# Patient Record
Sex: Female | Born: 1975 | Race: Black or African American | Hispanic: No | Marital: Married | Smoking: Never smoker
Health system: Southern US, Community
[De-identification: ages and names within clinical notes are randomized; demographics above are authoritative.]

## PROBLEM LIST (undated history)

## (undated) ENCOUNTER — Emergency Department (HOSPITAL_COMMUNITY): Payer: BC Managed Care – PPO

## (undated) DIAGNOSIS — O24419 Gestational diabetes mellitus in pregnancy, unspecified control: Secondary | ICD-10-CM

## (undated) DIAGNOSIS — E119 Type 2 diabetes mellitus without complications: Secondary | ICD-10-CM

## (undated) DIAGNOSIS — O139 Gestational [pregnancy-induced] hypertension without significant proteinuria, unspecified trimester: Secondary | ICD-10-CM

## (undated) HISTORY — PX: BREAST SURGERY: SHX581

## (undated) HISTORY — PX: BREAST LUMPECTOMY: SHX2

---

## 2000-07-07 ENCOUNTER — Inpatient Hospital Stay (HOSPITAL_COMMUNITY): Admission: AD | Admit: 2000-07-07 | Discharge: 2000-07-07 | Payer: Self-pay | Admitting: Obstetrics

## 2000-07-08 ENCOUNTER — Ambulatory Visit (HOSPITAL_COMMUNITY): Admission: RE | Admit: 2000-07-08 | Discharge: 2000-07-08 | Payer: Self-pay | Admitting: Obstetrics & Gynecology

## 2000-07-08 ENCOUNTER — Encounter: Payer: Self-pay | Admitting: Obstetrics & Gynecology

## 2000-07-13 ENCOUNTER — Ambulatory Visit (HOSPITAL_COMMUNITY): Admission: RE | Admit: 2000-07-13 | Discharge: 2000-07-13 | Payer: Self-pay | Admitting: Obstetrics & Gynecology

## 2000-07-13 ENCOUNTER — Encounter (INDEPENDENT_AMBULATORY_CARE_PROVIDER_SITE_OTHER): Payer: Self-pay

## 2003-08-05 ENCOUNTER — Other Ambulatory Visit: Admission: RE | Admit: 2003-08-05 | Discharge: 2003-08-05 | Payer: Self-pay | Admitting: Obstetrics & Gynecology

## 2007-02-04 ENCOUNTER — Ambulatory Visit (HOSPITAL_COMMUNITY): Admission: RE | Admit: 2007-02-04 | Discharge: 2007-02-04 | Payer: Self-pay | Admitting: Obstetrics & Gynecology

## 2007-04-25 ENCOUNTER — Ambulatory Visit (HOSPITAL_COMMUNITY): Admission: RE | Admit: 2007-04-25 | Discharge: 2007-04-25 | Payer: Self-pay | Admitting: Obstetrics & Gynecology

## 2007-05-06 ENCOUNTER — Encounter: Admission: RE | Admit: 2007-05-06 | Discharge: 2007-05-06 | Payer: Self-pay | Admitting: Obstetrics & Gynecology

## 2007-05-06 ENCOUNTER — Ambulatory Visit (HOSPITAL_COMMUNITY): Admission: RE | Admit: 2007-05-06 | Discharge: 2007-05-06 | Payer: Self-pay | Admitting: Obstetrics & Gynecology

## 2007-05-20 ENCOUNTER — Inpatient Hospital Stay (HOSPITAL_COMMUNITY): Admission: AD | Admit: 2007-05-20 | Discharge: 2007-05-20 | Payer: Self-pay | Admitting: Obstetrics & Gynecology

## 2007-05-24 ENCOUNTER — Ambulatory Visit (HOSPITAL_COMMUNITY): Admission: RE | Admit: 2007-05-24 | Discharge: 2007-05-24 | Payer: Self-pay | Admitting: Obstetrics & Gynecology

## 2007-06-13 ENCOUNTER — Inpatient Hospital Stay (HOSPITAL_COMMUNITY): Admission: AD | Admit: 2007-06-13 | Discharge: 2007-06-18 | Payer: Self-pay | Admitting: Obstetrics

## 2007-06-14 ENCOUNTER — Encounter: Payer: Self-pay | Admitting: Obstetrics

## 2009-05-25 IMAGING — US US OB FOLLOW-UP
1 series · 14 of 28 positions shown · non-contrast
Comparison: none

OBSTETRICAL ULTRASOUND:
 This ultrasound was performed in The [HOSPITAL], and the [facility] report will be stored to [REDACTED] PACS.

[Series 1: us ob follow-up · 14 of 69 slices shown]
[im 3/69]
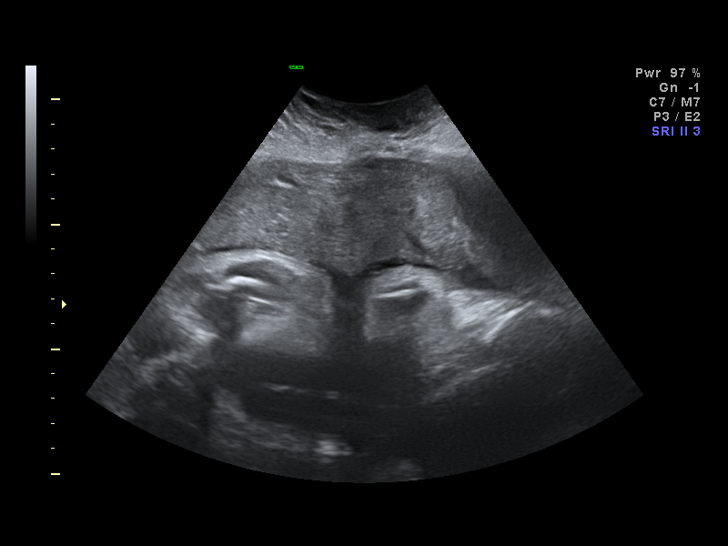
[im 8/69]
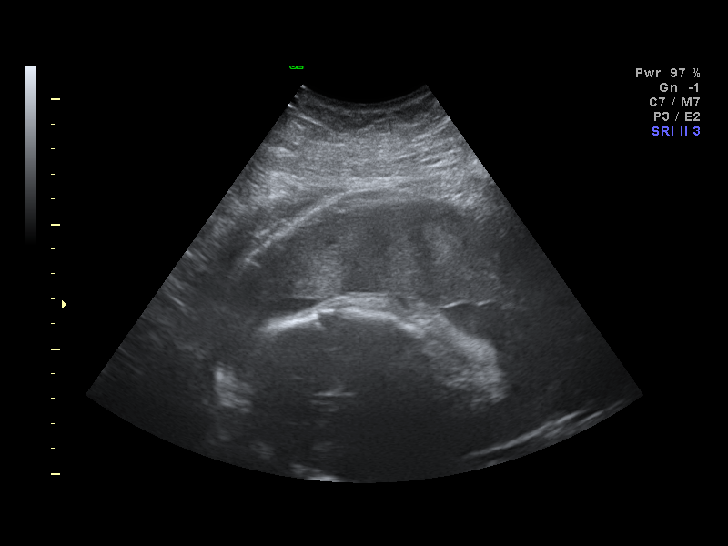
[im 13/69]
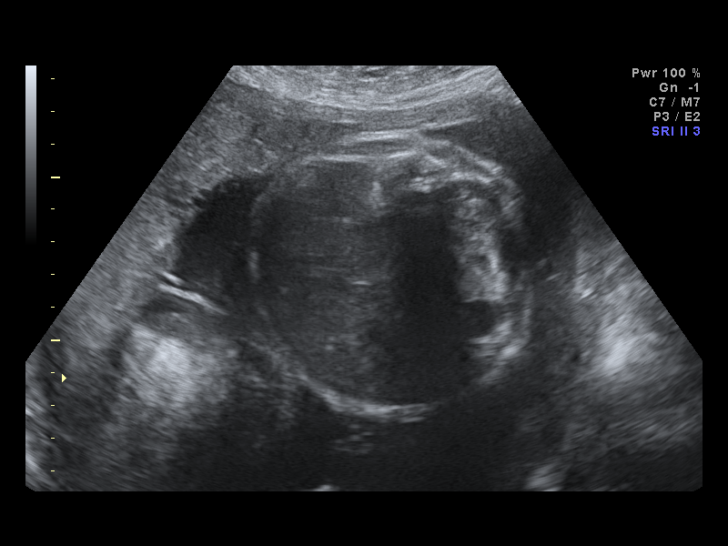
[im 18/69]
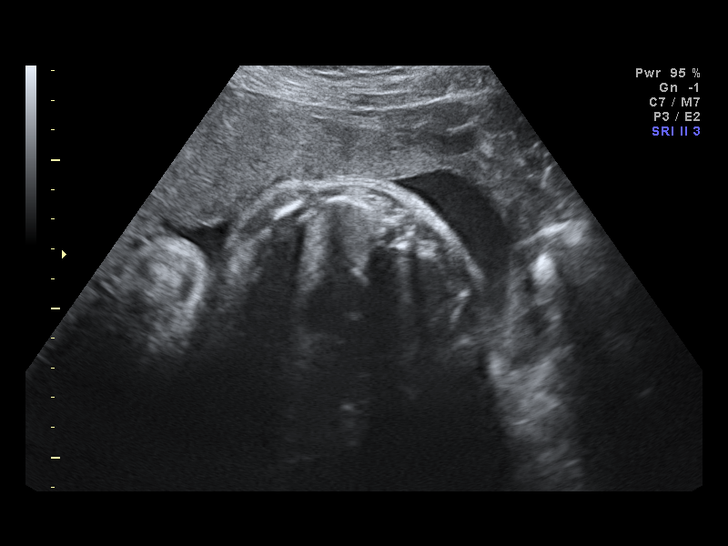
[im 23/69]
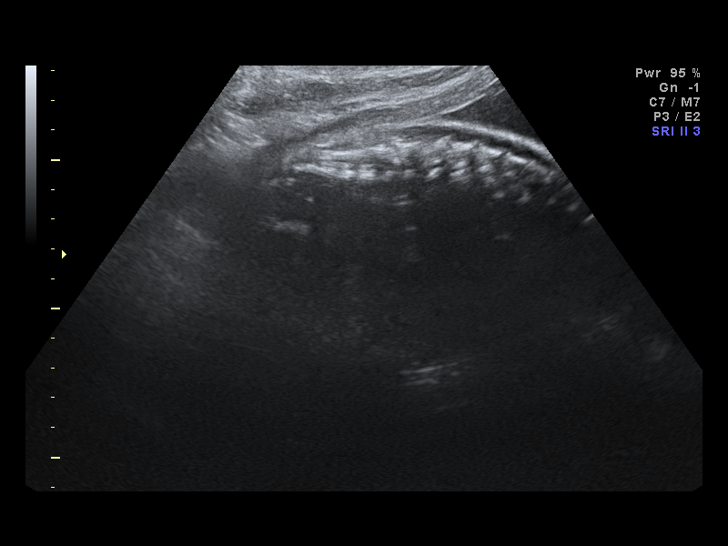
[im 28/69]
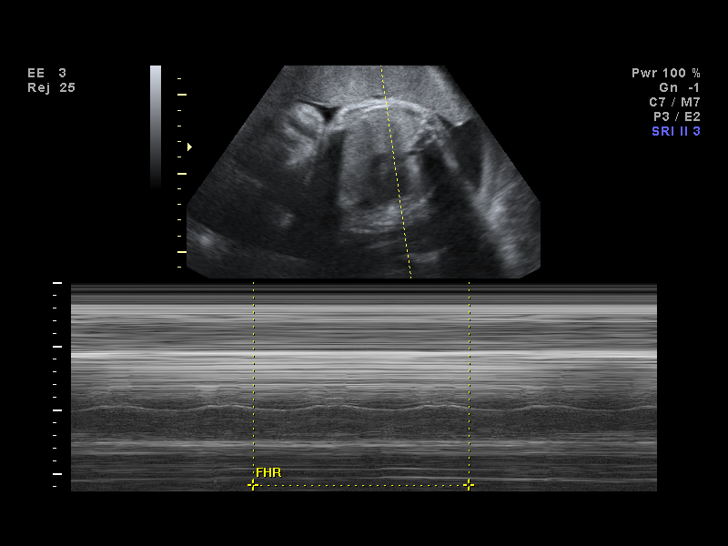
[im 33/69]
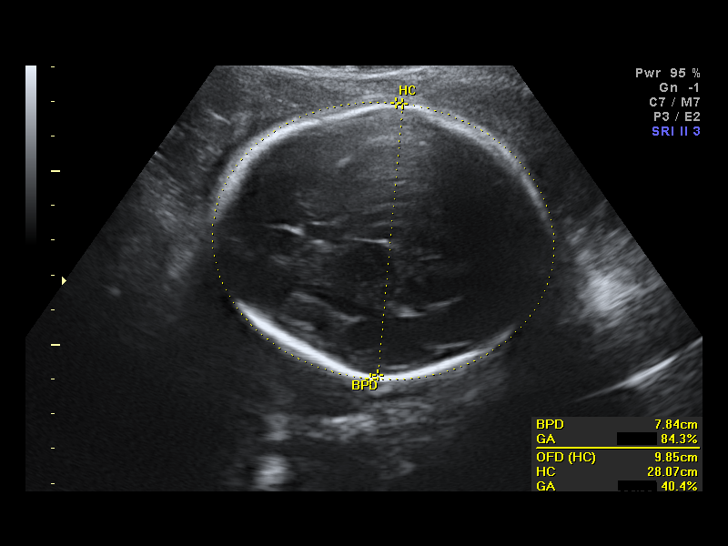
[im 38/69]
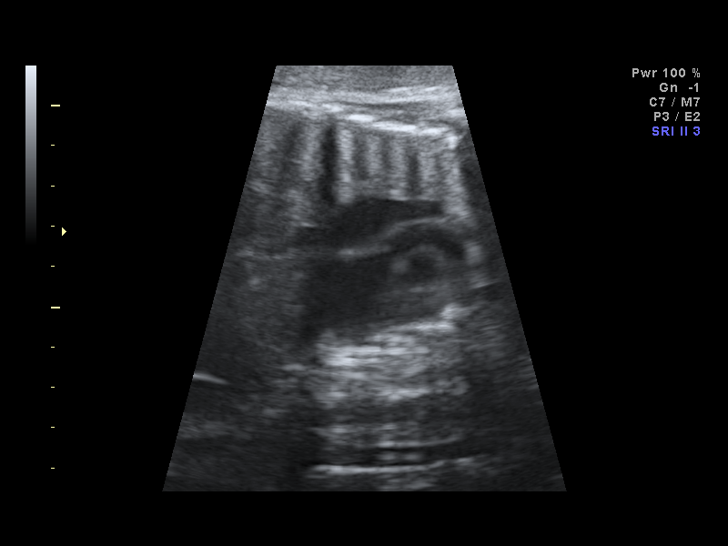
[im 43/69]
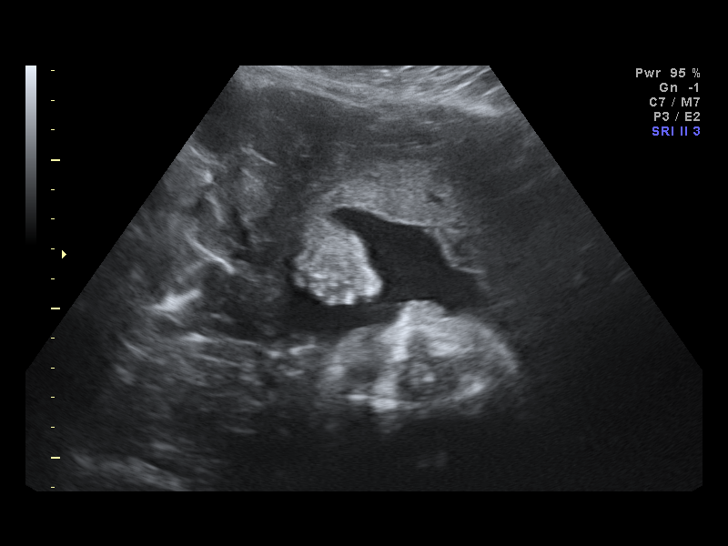
[im 48/69]
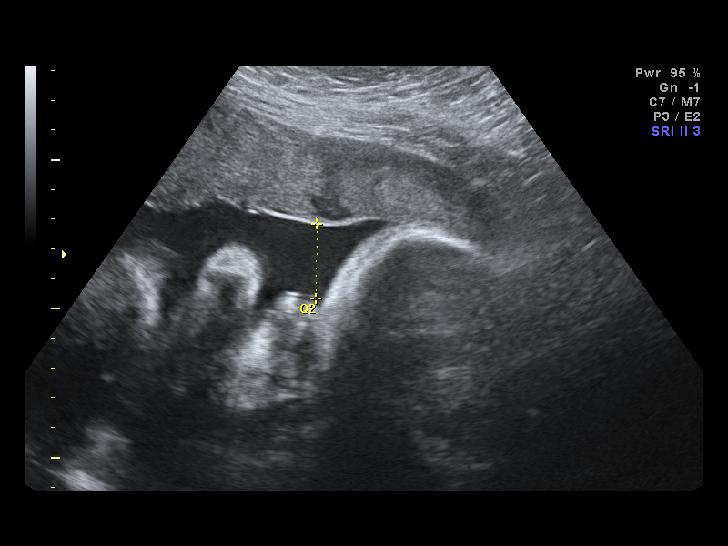
[im 53/69]
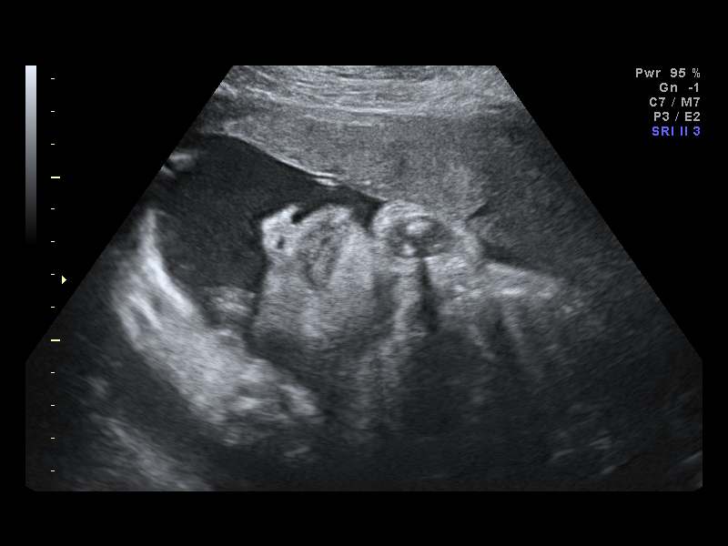
[im 58/69]
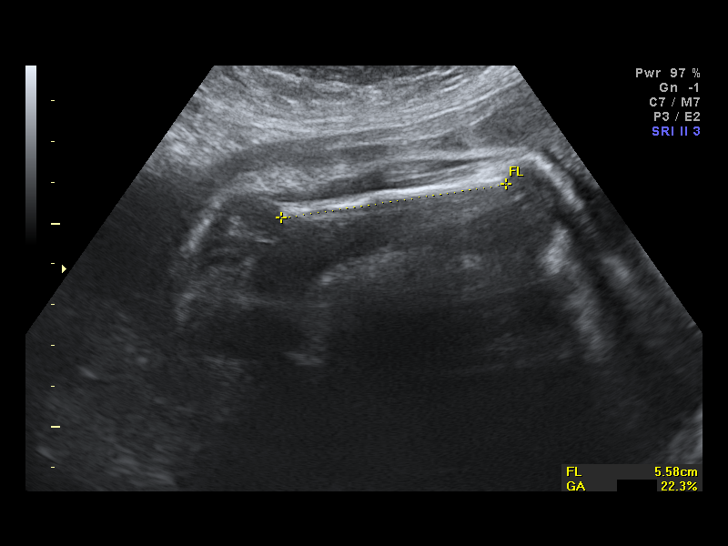
[im 63/69]
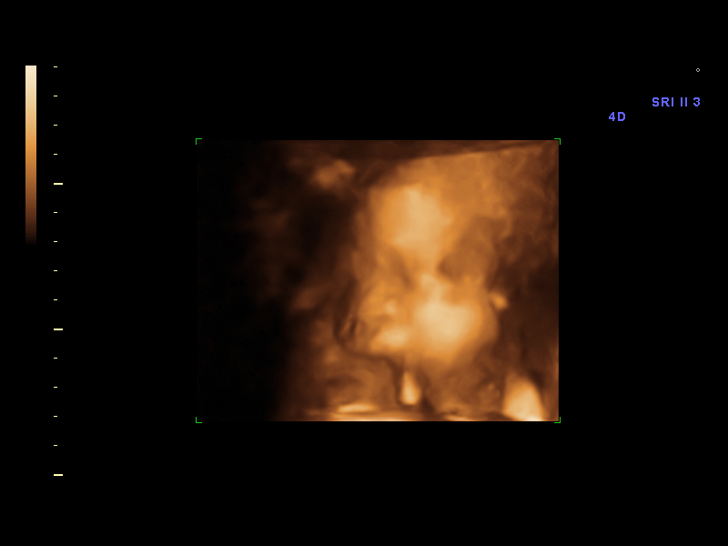
[im 69/69]
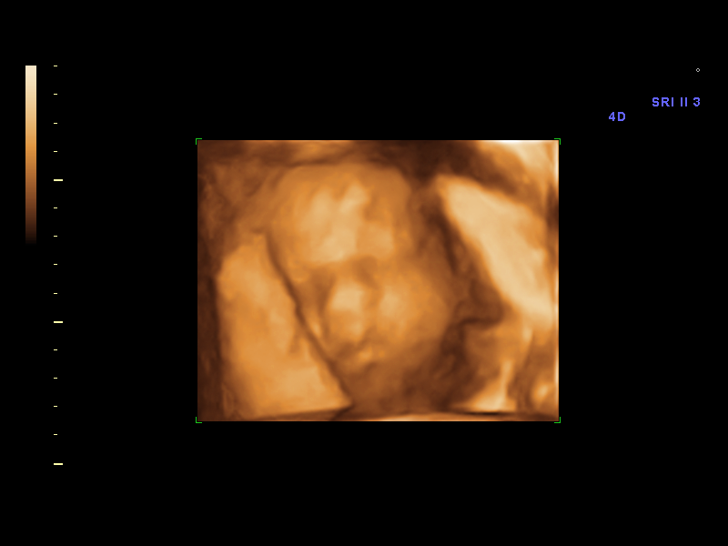

[14 of 28 positions shown; findings below may reference images not displayed]

IMPRESSION: The [facility] report has also been faxed to the ordering physician.

## 2010-08-28 ENCOUNTER — Encounter: Payer: Self-pay | Admitting: Obstetrics & Gynecology

## 2010-08-29 ENCOUNTER — Encounter: Payer: Self-pay | Admitting: Obstetrics & Gynecology

## 2010-12-20 NOTE — Discharge Summary (Signed)
[name], [name]                ACCOUNT NO.:  [id number]   MEDICAL RECORD NO.:  [id number]          PATIENT TYPE:  INP   LOCATION:  9101                          FACILITY:  [facility]   PHYSICIAN:  Charles A. Clearance Coots, M.D.DATE OF BIRTH:  05-21-1976   DATE OF ADMISSION:  06/13/2007  DATE OF DISCHARGE:  06/18/2007                               DISCHARGE SUMMARY   ADMITTING DIAGNOSES:  1. A [redacted] weeks gestation.  2. Gestational diabetes on insulin.  3. Suspected large for gestational age fetus.  4. Preeclampsia.   DISCHARGE DIAGNOSES:  1. A [redacted] weeks gestation.  2. Gestational diabetes on insulin.  3. Suspected large for gestational age fetus.  4. Preeclampsia.  5. Status post induction of labor for severe preeclampsia and primary      low transverse cesarean section for large-for-gestational age      fetus, with insulin-requiring gestational diabetes and severe      preeclampsia.   Primary low transverse cesarean section was performed without  complications on June 14, 2007.  Viable female was delivered at  10:04, Agpars at 8 at 1 minute and 9 at 5 minutes, weight of 35, 85  grams, length of 47 cm.  Mother and infant discharged home in good  condition.   REASON FOR ADMISSION:  A 31-year-ol para 0, estimated date of  confinement of November 18, presented for admission to the hospital for  increased blood pressures, with history of gestational diabetes on  insulin and a large-for-gestational age fetus suspected.  The patient  presented to the office on November 6, with blood pressures of 160/100  with no symptoms.  She was admitted to the hospital and started on  magnesium sulfate prophylaxis.  Blood pressures remained between 148-170  systolic/100-110 diastolic.  PIH labs were drawn, and the patient  continued to have increased blood pressures, and decision was made to  proceed with cesarean section delivery, for severe preeclampsia with  suspected large-for-gestational age fetus,  with a maternal history of  insulin-requiring gestational diabetes.  Primary low transverse cesarean  section was performed on June 14, 2007.  There were no intraoperative  complications.  The postoperative course was uncomplicated, and the  patient was discharged home on post-op day #4 in good condition.   DISCHARGE LABORATORY VALUES:  Hemoglobin 11, hematocrit 32, white blood  cell count 13,000, platelets 197,000.  Comprehensive metabolic panel was  within normal limits.  Admitting laboratory values:  Hemoglobin 13,  hematocrit 39, white blood cell count 6,100, platelets 186,000.  Comprehensive metabolic panel was within normal limits   DISCHARGE DISPOSITION:  Medications:  1. Tylox and ibuprofen was prescribed for pain.  Continue prenatal      vitamins.  2. Norvasc 5 mg p.o. daily, along with labetalol 200 mg p.o. twice a      day for blood pressure control.   Routine written instructions were given for preeclampsia precautions and  discharge after cesarean section.  The patient is to call office for a  follow-up appointment in 1 week.      Charles A. Clearance Coots, M.D.  Electronically Signed  CAH/MEDQ  D:  07/12/2007  T:  07/13/2007  Job:  [id number]

## 2010-12-20 NOTE — Op Note (Signed)
NAMEALVIN, [name] NO.:  [id number]   MEDICAL RECORD NO.:  [id number]          PATIENT TYPE:  INP   LOCATION:  NA                            FACILITY:  [facility]   PHYSICIAN:  Roseanna Rainbow, M.D.DATE OF BIRTH:  [birth year]   DATE OF PROCEDURE:  06/14/2007  DATE OF DISCHARGE:                               OPERATIVE REPORT   PREOPERATIVE DIAGNOSES:  1. Intrauterine pregnancy at 37 weeks.  2. Severe pregnancy-induced hypertension.  3. Gestational diabetes, insulin requiring.  4. Suspected large for gestational age fetus.   POSTOPERATIVE DIAGNOSES:  1. Intrauterine pregnancy at 37 weeks.  2. Severe pregnancy-induced hypertension.  3. Gestational diabetes, insulin requiring.  4. Suspected large for gestational age fetus.   PROCEDURE:  Primary low uterine flap elliptical cesarean delivery via  Pfannenstiel skin incision.   SURGEON:  Roseanna Rainbow, M.D.   PATHOLOGY:  Placenta.   ESTIMATED BLOOD LOSS:  600 mL.   COMPLICATIONS:  None.   IV FLUIDS/URINE OUTPUT:  As per anesthesiology.   PROCEDURE:  The patient was taken to the operating room with an IV  running.  A spinal anesthetic was administered.  She was then placed in  the dorsal supine position with a leftward tilt, and prepped and draped  in the usual sterile fashion.  After a time-out had been completed, a  Pfannenstiel skin incision was then made with the scalpel and carried  down to the underlying fascia.  The fascia was nicked in the midline.  The fascial incision was then extended bilaterally with curved Mayo  scissors.  The superior aspect of the fascial incision was then tented  up and the underlying rectus muscles dissected off.  The inferior aspect  of the fascial incision was then manipulated in a similar fashion.  The  rectus muscles were separated in the midline.  The parietoperitoneum was  then tented up and entered sharply.  This incision was then extended  superiorly and  inferiorly with good visualization of the bladder.  The  Alexis retractor was then placed into the incision.  The vesicouterine  peritoneum was tented up and entered sharply.  This incision was then  extended bilaterally and the bladder flap created bluntly.  The lower  uterine segment was then incised in a transverse fashion with a scalpel.  The incision was extended with bandage scissors.   The infant's head was delivered atraumatically with vacuum assistance.  The oropharynx was suctioned with the bulb suction.  The cord was  clamped and cut.  The infant was handed off to the awaiting  neonatologist.  A loose nuchal cord was noted.   The placenta was then removed.  The intrauterine cavity was evacuated of  any remaining amniotic fluid, clots and debris with a moistened  laparotomy sponge.  The uterine incision was then reapproximated in a  running interlocking fashion using 0 Monocryl.  A second imbricating  layer was then placed using a suture of the same.  Adequate hemostasis  was noted.  The paracolic gutters were then irrigated.  The retractor  was then removed.  The  parietoperitoneum was reapproximated in a running  fashion using 2-0 Vicryl.  The fascia was closed in a running fashion  using 0 Vicryl.  The skin was closed with staples.   At the close of the procedure the instrument and pack counts were said  to be correct x2.  Cephazolin 2 grams had been given at cord clamp.  The  patient was taken to the PACU awake and in stable condition.      Roseanna Rainbow, M.D.  Electronically Signed     LAJ/MEDQ  D:  06/14/2007  T:  06/15/2007  Job:  [id number]

## 2011-05-16 LAB — COMPREHENSIVE METABOLIC PANEL
ALT: 24
ALT: 28
AST: 31
AST: 33
Albumin: 1.9 — ABNORMAL LOW
Albumin: 2.5 — ABNORMAL LOW
Alkaline Phosphatase: 72
Alkaline Phosphatase: 99
BUN: 3 — ABNORMAL LOW
BUN: 5 — ABNORMAL LOW
CO2: 18 — ABNORMAL LOW
CO2: 22
Calcium: 7.5 — ABNORMAL LOW
Calcium: 8.5
Chloride: 107
Chloride: 112
Creatinine, Ser: 0.66
Creatinine, Ser: 0.76
GFR calc Af Amer: >60
GFR calc Af Amer: >60
GFR calc non Af Amer: >60
GFR calc non Af Amer: >60
Glucose, Bld: 165 — ABNORMAL HIGH
Glucose, Bld: 88
Potassium: 3.6
Potassium: 4.1
Sodium: 135
Sodium: 137
Total Bilirubin: 0.6
Total Bilirubin: 0.7
Total Protein: 4.7 — ABNORMAL LOW
Total Protein: 5.7 — ABNORMAL LOW

## 2011-05-16 LAB — CBC
HCT: 32.5 — ABNORMAL LOW
HCT: 36
HCT: 39.1
Hemoglobin: 11.1 — ABNORMAL LOW
Hemoglobin: 12.3
Hemoglobin: 13.4
MCHC: 34.1
MCHC: 34.2
MCHC: 34.3
MCV: 82.5
MCV: 82.6
MCV: 83.2
Platelets: 186
Platelets: 197
Platelets: 199
RBC: 3.9
RBC: 4.35
RBC: 4.74
RDW: 15.8 — ABNORMAL HIGH
RDW: 16.2 — ABNORMAL HIGH
RDW: 16.3 — ABNORMAL HIGH
WBC: 13.2 — ABNORMAL HIGH
WBC: 5.7
WBC: 6.1

## 2011-05-16 LAB — URIC ACID
Uric Acid, Serum: 7.1 — ABNORMAL HIGH
Uric Acid, Serum: 8 — ABNORMAL HIGH

## 2011-05-16 LAB — RPR: RPR Ser Ql: NONREACTIVE

## 2011-05-16 LAB — ABO/RH: ABO/RH(D): A POS

## 2011-05-16 LAB — LACTATE DEHYDROGENASE
LDH: 227
LDH: 270 — ABNORMAL HIGH

## 2011-05-16 LAB — MAGNESIUM: Magnesium: 5.3 — ABNORMAL HIGH

## 2011-05-18 LAB — URINE CULTURE: Colony Count: 60000

## 2011-05-18 LAB — URINE MICROSCOPIC-ADD ON

## 2011-05-18 LAB — URINALYSIS, ROUTINE W REFLEX MICROSCOPIC
Bilirubin Urine: NEGATIVE
Glucose, UA: >1000 — AB
Hgb urine dipstick: NEGATIVE
Ketones, ur: NEGATIVE
Leukocytes, UA: NEGATIVE
Nitrite: NEGATIVE
Protein, ur: NEGATIVE
Specific Gravity, Urine: <1.005 — ABNORMAL LOW
Urobilinogen, UA: 0.2
pH: 6

## 2012-11-11 ENCOUNTER — Emergency Department (INDEPENDENT_AMBULATORY_CARE_PROVIDER_SITE_OTHER)
Admission: EM | Admit: 2012-11-11 | Discharge: 2012-11-11 | Disposition: A | Discharge status: Home / Self Care | Payer: PRIVATE HEALTH INSURANCE | Source: Home / Self Care | Attending: Emergency Medicine | Admitting: Emergency Medicine

## 2012-11-11 ENCOUNTER — Encounter (HOSPITAL_COMMUNITY): Payer: Self-pay | Admitting: Emergency Medicine

## 2012-11-11 DIAGNOSIS — J45909 Unspecified asthma, uncomplicated: Secondary | ICD-10-CM

## 2012-11-11 DIAGNOSIS — J029 Acute pharyngitis, unspecified: Secondary | ICD-10-CM

## 2012-11-11 DIAGNOSIS — J069 Acute upper respiratory infection, unspecified: Secondary | ICD-10-CM

## 2012-11-11 LAB — POCT RAPID STREP A: Streptococcus, Group A Screen (Direct): NEGATIVE

## 2012-11-11 MED ORDER — BENZONATATE 100 MG PO CAPS: 100.0000 mg | ORAL_CAPSULE | Freq: Three times a day (TID) | ORAL | Status: DC | PRN

## 2012-11-11 MED ORDER — FEXOFENADINE HCL 60 MG PO TABS: 60.0000 mg | ORAL_TABLET | Freq: Every day | ORAL | Status: DC

## 2012-11-11 MED ORDER — ALBUTEROL SULFATE HFA 108 (90 BASE) MCG/ACT IN AERS: 1.0000 | INHALATION_SPRAY | Freq: Four times a day (QID) | RESPIRATORY_TRACT | Status: DC | PRN

## 2012-11-11 NOTE — ED Provider Notes (Signed)
History     CSN: [id number]  Arrival date & time 11/11/12  1048   First MD Initiated Contact with Patient 11/11/12 1100      Chief Complaint  Patient presents with  . Fever    (Consider location/radiation/quality/duration/timing/severity/associated sxs/prior treatment) HPI Comments: Patient presents urgent care complaining that since Friday she has been experiencing a fever, runny and congested nose left-sided ear pain. She has also been coughing and describes it at night she hears herself wheezing. Today she is coughing hot or and is making her chest hurts. At this point she denies any shortness of breath her last temperature was yesterday of 101. Has a mild congested nose. In her left ear continues to hurt. Denies any nausea vomiting abdominal pain or diarrheas.  Patient is a 37 y.o. female presenting with fever. The history is provided by the patient.  Fever Temp source:  Oral Onset quality:  Gradual Progression:  Waxing and waning Associated symptoms: chills, congestion, cough, myalgias, rhinorrhea and sore throat   Associated symptoms: no diarrhea, no ear pain, no rash and no vomiting   Risk factors: no immunosuppression, no recent travel and no sick contacts     History reviewed. No pertinent past medical history.  Past Surgical History  Procedure Laterality Date  . Breast surgery    . Breast lumpectomy      No family history on file.  History  Substance Use Topics  . Smoking status: Current Every Day Smoker  . Smokeless tobacco: Not on file  . Alcohol Use: Yes    OB History   Grav Para Term Preterm Abortions TAB SAB Ect Mult Living                  Review of Systems  Constitutional: Positive for fever, chills, activity change and appetite change. Negative for fatigue.  HENT: Positive for congestion, sore throat and rhinorrhea. Negative for ear pain.   Respiratory: Positive for cough and wheezing.   Gastrointestinal: Negative for vomiting, abdominal pain and  diarrhea.  Musculoskeletal: Positive for myalgias.  Skin: Negative for rash and wound.    Allergies  Codeine  Home Medications   Current Outpatient Rx  Name  Route  Sig  Dispense  Refill  . albuterol (PROVENTIL HFA;VENTOLIN HFA) 108 (90 BASE) MCG/ACT inhaler   Inhalation   Inhale 1-2 puffs into the lungs every 6 (six) hours as needed for wheezing.   1 Inhaler   0   . benzonatate (TESSALON) 100 MG capsule   Oral   Take 1 capsule (100 mg total) by mouth 3 (three) times daily as needed for cough.   21 capsule   0   . fexofenadine (ALLEGRA) 60 MG tablet   Oral   Take 1 tablet (60 mg total) by mouth daily.   14 tablet   0     BP 127/88  Pulse 88  Temp(Src) 98.9 F (37.2 C) (Oral)  SpO2 97%  LMP 10/22/2012  Physical Exam  Nursing note and vitals reviewed. Constitutional: She is oriented to person, place, and time. Vital signs are normal. She appears well-developed.  Non-toxic appearance. She does not have a sickly appearance. She does not appear ill. No distress.  HENT:  Head: Normocephalic.  Right Ear: Tympanic membrane normal.  Left Ear: Tympanic membrane normal.  Nose: Rhinorrhea present.  Mouth/Throat: Uvula is midline and mucous membranes are normal. Posterior oropharyngeal erythema present. No oropharyngeal exudate, posterior oropharyngeal edema or tonsillar abscesses.  Eyes: Conjunctivae are normal. No  scleral icterus.  Neck: Neck supple. No JVD present.  Cardiovascular: Normal rate.  Exam reveals no gallop and no friction rub.   No murmur heard. Pulmonary/Chest: Effort normal and breath sounds normal. No respiratory distress. She has no wheezes. She has no rales. She exhibits no tenderness.  Abdominal: Soft.  Neurological: She is alert and oriented to person, place, and time.  Skin: No rash noted. No erythema.    ED Course  Procedures (including critical care time)  Labs Reviewed  POCT RAPID STREP A ([facility] CARE ONLY)   No results found.   1. Upper  respiratory infection   2. Pharyngitis   3. Reactive airway disease with wheezing       MDM  Symptoms and exam were most consistent with a viral upper respiratory infection associated with reactive airway disease. Patient looks comfortable afebrile. No breathing difficulty will treat patient with antitussives a bronchodilator and an antihistamine have encouraged patient to return if any changes or no improvement after 48 hours or further fevers. Patient agrees with treatment plan and followup care as necessary.        Jimmie Molly, MD 11/11/12 [phone]

## 2012-11-11 NOTE — ED Notes (Signed)
Patient reports onset of symptoms started Friday.  Fever and ear ache.  C/o wheezing when she lies down, cough, chest hurts with coughing.

## 2012-12-03 ENCOUNTER — Telehealth: Payer: Self-pay | Admitting: *Deleted

## 2012-12-03 NOTE — Telephone Encounter (Signed)
Patient called regarding BV.  Patient has had BV in the past and is requesting Flagyl.  Her symptoms are discharge with an odor.  Metronidazole sent to pharmacy per nursing protocol..  Pt states that she always gets a yeast infection following an antibiotic and is also requesting Diflucan.  Diflucan sent to pharmacy per nursing protocol.

## 2013-05-01 ENCOUNTER — Ambulatory Visit (INDEPENDENT_AMBULATORY_CARE_PROVIDER_SITE_OTHER): Payer: PRIVATE HEALTH INSURANCE | Admitting: Obstetrics & Gynecology

## 2013-05-01 ENCOUNTER — Encounter: Payer: Self-pay | Admitting: Obstetrics & Gynecology

## 2013-05-01 VITALS — BP 148/95 | HR 66 | Temp 98.3°F | Wt 189.0 lb

## 2013-05-01 DIAGNOSIS — N390 Urinary tract infection, site not specified: Secondary | ICD-10-CM

## 2013-05-01 LAB — POCT URINALYSIS DIPSTICK
Bilirubin, UA: NEGATIVE
Blood, UA: 50
Glucose, UA: NEGATIVE
Ketones, UA: NEGATIVE
Nitrite, UA: NEGATIVE
Protein, UA: 1
Spec Grav, UA: <=1.005
Urobilinogen, UA: NEGATIVE
pH, UA: 8.5

## 2013-05-01 MED ORDER — SULFAMETHOXAZOLE-TMP DS 800-160 MG PO TABS: 1.0000 | ORAL_TABLET | Freq: Two times a day (BID) | ORAL | Status: DC

## 2013-05-01 NOTE — Progress Notes (Signed)
Pt in office today for a possible UTI. Pt states she is having urinary frequency. Pt states she is having the urge to urinate but not able to urinate. Pt states she is having pain on her sides.  Urine culture sent to lab. Rx sent to patient's pharmacy per nursing protocol. Pt advised to call if symptoms persist.

## 2013-05-02 ENCOUNTER — Encounter: Payer: Self-pay | Admitting: Obstetrics & Gynecology

## 2013-05-03 ENCOUNTER — Encounter: Payer: Self-pay | Admitting: Obstetrics & Gynecology

## 2013-05-03 DIAGNOSIS — N390 Urinary tract infection, site not specified: Secondary | ICD-10-CM | POA: Insufficient documentation

## 2013-05-03 LAB — URINE CULTURE: Colony Count: >=100000

## 2013-09-03 ENCOUNTER — Other Ambulatory Visit: Payer: Self-pay | Admitting: Obstetrics & Gynecology

## 2014-08-03 ENCOUNTER — Encounter: Payer: Self-pay | Admitting: *Deleted

## 2015-02-05 ENCOUNTER — Other Ambulatory Visit (HOSPITAL_COMMUNITY): Payer: Self-pay | Admitting: Obstetrics and Gynecology

## 2015-02-05 DIAGNOSIS — N96 Recurrent pregnancy loss: Secondary | ICD-10-CM

## 2015-02-10 ENCOUNTER — Ambulatory Visit (HOSPITAL_COMMUNITY)
Admission: RE | Admit: 2015-02-10 | Discharge: 2015-02-10 | Disposition: A | Discharge status: Home / Self Care | Payer: PRIVATE HEALTH INSURANCE | Source: Ambulatory Visit | Attending: Obstetrics and Gynecology | Admitting: Obstetrics and Gynecology

## 2015-02-10 DIAGNOSIS — N96 Recurrent pregnancy loss: Secondary | ICD-10-CM | POA: Diagnosis not present

## 2015-02-10 MED ORDER — IOHEXOL 300 MG/ML  SOLN
30.0000 mL | Freq: Once | INTRAMUSCULAR | Status: AC | PRN
  Administered 2015-02-10: 30 mL

## 2016-01-14 LAB — OB RESULTS CONSOLE GC/CHLAMYDIA
Chlamydia: NEGATIVE
Gonorrhea: NEGATIVE

## 2016-01-14 LAB — OB RESULTS CONSOLE RPR: RPR: NONREACTIVE

## 2016-01-14 LAB — OB RESULTS CONSOLE ABO/RH: RH Type: POSITIVE

## 2016-01-14 LAB — OB RESULTS CONSOLE HEPATITIS B SURFACE ANTIGEN: Hepatitis B Surface Ag: NEGATIVE

## 2016-01-14 LAB — OB RESULTS CONSOLE HIV ANTIBODY (ROUTINE TESTING): HIV: NONREACTIVE

## 2016-01-14 LAB — OB RESULTS CONSOLE RUBELLA ANTIBODY, IGM: Rubella: IMMUNE

## 2016-02-28 ENCOUNTER — Encounter: Payer: 59 | Attending: Obstetrics and Gynecology | Admitting: Skilled Nursing Facility1

## 2016-02-28 DIAGNOSIS — O99810 Abnormal glucose complicating pregnancy: Secondary | ICD-10-CM | POA: Insufficient documentation

## 2016-02-28 DIAGNOSIS — O24410 Gestational diabetes mellitus in pregnancy, diet controlled: Secondary | ICD-10-CM

## 2016-02-29 ENCOUNTER — Encounter: Payer: Self-pay | Admitting: Skilled Nursing Facility1

## 2016-02-29 NOTE — Progress Notes (Signed)
  Patient was seen on 02/28/2016 for Gestational Diabetes self-management class at the [facility]. The following learning objectives were met by the patient during this course:   States the definition of Gestational Diabetes  States why dietary management is important in controlling blood glucose  Describes the effects each nutrient has on blood glucose levels  Demonstrates ability to create a balanced meal plan  Demonstrates carbohydrate counting   States when to check blood glucose levels involving a total of 4 separate occurences in a day  Demonstrates proper blood glucose monitoring techniques  States the effect of stress and exercise on blood glucose levels  States the importance of limiting caffeine and abstaining from alcohol and smoking  Demonstrates the knowledge the glucometer provided in class may not be covered by their insurance and to call their insurance provider immediately after class to know which glucometer their insurance provider does cover as well as calling their physician the next day for a prescription to the glucometer their insurance does cover (if the one provided is not) as well as the lancets and strips for that meter.  Blood glucose monitor given: accucheck guide Lot # [id number] Exp: 03/06/2017 Blood glucose reading: 212 (pt states she had cereal and a plum about 2 hours ago-used this as an opportunity for a teachable moment)  Patient instructed to monitor glucose levels: FBS: 60 - <90 1 hour: <140 2 hour: <120  *Patient received handouts:  Nutrition Diabetes and Pregnancy  Carbohydrate Counting List  Patient will be seen for follow-up as needed.

## 2016-05-19 LAB — OB RESULTS CONSOLE RPR: RPR: NONREACTIVE

## 2016-06-20 ENCOUNTER — Other Ambulatory Visit: Payer: Self-pay | Admitting: Obstetrics and Gynecology

## 2016-07-07 LAB — OB RESULTS CONSOLE GBS: GBS: POSITIVE

## 2016-07-16 ENCOUNTER — Encounter (HOSPITAL_COMMUNITY): Payer: Self-pay

## 2016-07-16 ENCOUNTER — Inpatient Hospital Stay (HOSPITAL_COMMUNITY)
Admission: AD | Admit: 2016-07-16 | Discharge: 2016-07-20 | DRG: 765 | Disposition: A | Discharge status: Home / Self Care | Payer: 59 | Source: Ambulatory Visit | Attending: Obstetrics and Gynecology | Admitting: Obstetrics and Gynecology

## 2016-07-16 DIAGNOSIS — O24425 Gestational diabetes mellitus in childbirth, controlled by oral hypoglycemic drugs: Principal | ICD-10-CM | POA: Diagnosis present

## 2016-07-16 DIAGNOSIS — R030 Elevated blood-pressure reading, without diagnosis of hypertension: Secondary | ICD-10-CM | POA: Diagnosis not present

## 2016-07-16 DIAGNOSIS — O42013 Preterm premature rupture of membranes, onset of labor within 24 hours of rupture, third trimester: Secondary | ICD-10-CM | POA: Diagnosis present

## 2016-07-16 DIAGNOSIS — O9089 Other complications of the puerperium, not elsewhere classified: Secondary | ICD-10-CM | POA: Diagnosis not present

## 2016-07-16 DIAGNOSIS — O24419 Gestational diabetes mellitus in pregnancy, unspecified control: Secondary | ICD-10-CM

## 2016-07-16 DIAGNOSIS — Z6841 Body Mass Index (BMI) 40.0 and over, adult: Secondary | ICD-10-CM

## 2016-07-16 DIAGNOSIS — Z7984 Long term (current) use of oral hypoglycemic drugs: Secondary | ICD-10-CM

## 2016-07-16 DIAGNOSIS — O34211 Maternal care for low transverse scar from previous cesarean delivery: Secondary | ICD-10-CM | POA: Diagnosis present

## 2016-07-16 DIAGNOSIS — O34219 Maternal care for unspecified type scar from previous cesarean delivery: Secondary | ICD-10-CM

## 2016-07-16 DIAGNOSIS — O99824 Streptococcus B carrier state complicating childbirth: Secondary | ICD-10-CM | POA: Diagnosis present

## 2016-07-16 DIAGNOSIS — Z3A36 36 weeks gestation of pregnancy: Secondary | ICD-10-CM

## 2016-07-16 DIAGNOSIS — O99214 Obesity complicating childbirth: Secondary | ICD-10-CM | POA: Diagnosis present

## 2016-07-16 HISTORY — DX: Type 2 diabetes mellitus without complications: E11.9

## 2016-07-16 LAB — CBC
HCT: 36 % (ref 36.0–46.0)
Hemoglobin: 12.2 g/dL (ref 12.0–15.0)
MCH: 26 pg (ref 26.0–34.0)
MCHC: 33.9 g/dL (ref 30.0–36.0)
MCV: 76.6 fL — ABNORMAL LOW (ref 78.0–100.0)
Platelets: 228 10*3/uL (ref 150–400)
RBC: 4.7 MIL/uL (ref 3.87–5.11)
RDW: 16.6 % — ABNORMAL HIGH (ref 11.5–15.5)
WBC: 6.9 10*3/uL (ref 4.0–10.5)

## 2016-07-16 LAB — POCT FERN TEST: POCT Fern Test: POSITIVE

## 2016-07-16 MED ORDER — SOD CITRATE-CITRIC ACID 500-334 MG/5ML PO SOLN
30.0000 mL | Freq: Once | ORAL | Status: AC
  Administered 2016-07-17 (×2): 30 mL via ORAL
  Filled 2016-07-16: qty 15

## 2016-07-16 MED ORDER — FAMOTIDINE IN NACL 20-0.9 MG/50ML-% IV SOLN
20.0000 mg | Freq: Once | INTRAVENOUS | Status: AC
  Administered 2016-07-17: 20 mg via INTRAVENOUS
  Filled 2016-07-16: qty 50

## 2016-07-16 MED ORDER — CEFAZOLIN SODIUM-DEXTROSE 2-4 GM/100ML-% IV SOLN
2.0000 g | INTRAVENOUS | Status: AC
  Administered 2016-07-17: 2 g via INTRAVENOUS
  Filled 2016-07-16: qty 100

## 2016-07-16 MED ORDER — LACTATED RINGERS IV SOLN
INTRAVENOUS | Status: DC
  Administered 2016-07-16: 125 mL/h via INTRAVENOUS
  Administered 2016-07-17 (×2): via INTRAVENOUS

## 2016-07-16 NOTE — MAU Note (Signed)
Pt states that water broke at 11pm-clear fluid. Having irregular contractions. Has had a decrease in fetal movement today. Last felt movement around 8pm. Pt scheduled for repeat c/s on 08/03/2016.

## 2016-07-17 ENCOUNTER — Inpatient Hospital Stay (HOSPITAL_COMMUNITY): Payer: 59 | Admitting: Anesthesiology

## 2016-07-17 ENCOUNTER — Encounter (HOSPITAL_COMMUNITY): Payer: Self-pay

## 2016-07-17 ENCOUNTER — Encounter (HOSPITAL_COMMUNITY)
Admission: AD | Disposition: A | Discharge status: Home / Self Care | Payer: Self-pay | Source: Ambulatory Visit | Attending: Obstetrics and Gynecology

## 2016-07-17 DIAGNOSIS — R030 Elevated blood-pressure reading, without diagnosis of hypertension: Secondary | ICD-10-CM | POA: Diagnosis not present

## 2016-07-17 DIAGNOSIS — O34211 Maternal care for low transverse scar from previous cesarean delivery: Secondary | ICD-10-CM | POA: Diagnosis present

## 2016-07-17 DIAGNOSIS — O42013 Preterm premature rupture of membranes, onset of labor within 24 hours of rupture, third trimester: Secondary | ICD-10-CM | POA: Diagnosis present

## 2016-07-17 DIAGNOSIS — Z3A36 36 weeks gestation of pregnancy: Secondary | ICD-10-CM | POA: Diagnosis not present

## 2016-07-17 DIAGNOSIS — Z6841 Body Mass Index (BMI) 40.0 and over, adult: Secondary | ICD-10-CM | POA: Diagnosis not present

## 2016-07-17 DIAGNOSIS — O9089 Other complications of the puerperium, not elsewhere classified: Secondary | ICD-10-CM | POA: Diagnosis not present

## 2016-07-17 DIAGNOSIS — O99824 Streptococcus B carrier state complicating childbirth: Secondary | ICD-10-CM | POA: Diagnosis present

## 2016-07-17 DIAGNOSIS — O99214 Obesity complicating childbirth: Secondary | ICD-10-CM | POA: Diagnosis present

## 2016-07-17 DIAGNOSIS — O24425 Gestational diabetes mellitus in childbirth, controlled by oral hypoglycemic drugs: Secondary | ICD-10-CM | POA: Diagnosis present

## 2016-07-17 DIAGNOSIS — O24419 Gestational diabetes mellitus in pregnancy, unspecified control: Secondary | ICD-10-CM

## 2016-07-17 DIAGNOSIS — Z7984 Long term (current) use of oral hypoglycemic drugs: Secondary | ICD-10-CM | POA: Diagnosis not present

## 2016-07-17 DIAGNOSIS — O34219 Maternal care for unspecified type scar from previous cesarean delivery: Secondary | ICD-10-CM | POA: Diagnosis present

## 2016-07-17 LAB — CBC
HCT: 33.6 % — ABNORMAL LOW (ref 36.0–46.0)
Hemoglobin: 11.4 g/dL — ABNORMAL LOW (ref 12.0–15.0)
MCH: 25.9 pg — ABNORMAL LOW (ref 26.0–34.0)
MCHC: 33.9 g/dL (ref 30.0–36.0)
MCV: 76.4 fL — ABNORMAL LOW (ref 78.0–100.0)
Platelets: 224 10*3/uL (ref 150–400)
RBC: 4.4 MIL/uL (ref 3.87–5.11)
RDW: 16.4 % — ABNORMAL HIGH (ref 11.5–15.5)
WBC: 14.2 10*3/uL — ABNORMAL HIGH (ref 4.0–10.5)

## 2016-07-17 LAB — GLUCOSE, CAPILLARY
Glucose-Capillary: 146 mg/dL — ABNORMAL HIGH (ref 65–99)
Glucose-Capillary: 158 mg/dL — ABNORMAL HIGH (ref 65–99)
Glucose-Capillary: 164 mg/dL — ABNORMAL HIGH (ref 65–99)

## 2016-07-17 LAB — TYPE AND SCREEN
ABO/RH(D): A POS
Antibody Screen: NEGATIVE

## 2016-07-17 LAB — RPR: RPR Ser Ql: NONREACTIVE

## 2016-07-17 SURGERY — Surgical Case
Anesthesia: Spinal

## 2016-07-17 MED ORDER — OXYTOCIN 40 UNITS IN LACTATED RINGERS INFUSION - SIMPLE MED: 2.5000 [IU]/h | INTRAVENOUS | Status: AC

## 2016-07-17 MED ORDER — BUPIVACAINE IN DEXTROSE 0.75-8.25 % IT SOLN
INTRATHECAL | Status: DC | PRN
  Administered 2016-07-17: 1.6 mL via INTRATHECAL

## 2016-07-17 MED ORDER — METFORMIN HCL 500 MG PO TABS
1000.0000 mg | ORAL_TABLET | Freq: Every day | ORAL | Status: DC
  Administered 2016-07-17: 1000 mg via ORAL
  Filled 2016-07-17 (×2): qty 2

## 2016-07-17 MED ORDER — BUPIVACAINE HCL (PF) 0.25 % IJ SOLN
INTRAMUSCULAR | Status: AC
  Filled 2016-07-17: qty 30

## 2016-07-17 MED ORDER — MORPHINE SULFATE (PF) 0.5 MG/ML IJ SOLN
INTRAMUSCULAR | Status: DC | PRN
  Administered 2016-07-17: .2 mg via INTRATHECAL

## 2016-07-17 MED ORDER — PHENYLEPHRINE 8 MG IN D5W 100 ML (0.08MG/ML) PREMIX OPTIME
INJECTION | INTRAVENOUS | Status: AC
  Filled 2016-07-17: qty 100

## 2016-07-17 MED ORDER — BUPIVACAINE HCL (PF) 0.25 % IJ SOLN
INTRAMUSCULAR | Status: DC | PRN
  Administered 2016-07-17: 10 mL

## 2016-07-17 MED ORDER — SIMETHICONE 80 MG PO CHEW: 80.0000 mg | CHEWABLE_TABLET | ORAL | Status: DC | PRN

## 2016-07-17 MED ORDER — PHENYLEPHRINE 40 MCG/ML (10ML) SYRINGE FOR IV PUSH (FOR BLOOD PRESSURE SUPPORT)
PREFILLED_SYRINGE | INTRAVENOUS | Status: AC
  Filled 2016-07-17: qty 20

## 2016-07-17 MED ORDER — KETOROLAC TROMETHAMINE 30 MG/ML IJ SOLN
INTRAMUSCULAR | Status: AC
  Filled 2016-07-17: qty 1

## 2016-07-17 MED ORDER — PENICILLIN G POTASSIUM 5000000 UNITS IJ SOLR
5.0000 10*6.[IU] | Freq: Once | INTRAVENOUS | Status: AC
  Administered 2016-07-17: 5 10*6.[IU] via INTRAVENOUS
  Filled 2016-07-17: qty 5

## 2016-07-17 MED ORDER — PHENYLEPHRINE 8 MG IN D5W 100 ML (0.08MG/ML) PREMIX OPTIME
INJECTION | INTRAVENOUS | Status: DC | PRN
  Administered 2016-07-17: 40 ug/min via INTRAVENOUS

## 2016-07-17 MED ORDER — DIPHENHYDRAMINE HCL 25 MG PO CAPS: 25.0000 mg | ORAL_CAPSULE | Freq: Four times a day (QID) | ORAL | Status: DC | PRN

## 2016-07-17 MED ORDER — OXYTOCIN 10 UNIT/ML IJ SOLN
INTRAVENOUS | Status: DC | PRN
  Administered 2016-07-17: 40 [IU] via INTRAVENOUS

## 2016-07-17 MED ORDER — DIPHENHYDRAMINE HCL 50 MG/ML IJ SOLN: 12.5000 mg | INTRAMUSCULAR | Status: DC | PRN

## 2016-07-17 MED ORDER — WITCH HAZEL-GLYCERIN EX PADS: 1.0000 "application " | MEDICATED_PAD | CUTANEOUS | Status: DC | PRN

## 2016-07-17 MED ORDER — NALBUPHINE HCL 10 MG/ML IJ SOLN: 5.0000 mg | Freq: Once | INTRAMUSCULAR | Status: DC | PRN

## 2016-07-17 MED ORDER — SIMETHICONE 80 MG PO CHEW
80.0000 mg | CHEWABLE_TABLET | Freq: Three times a day (TID) | ORAL | Status: DC
Start: 2016-07-17 — End: 2016-07-20
  Administered 2016-07-17 – 2016-07-20 (×9): 80 mg via ORAL
  Filled 2016-07-17 (×8): qty 1

## 2016-07-17 MED ORDER — MEPERIDINE HCL 25 MG/ML IJ SOLN
INTRAMUSCULAR | Status: AC
  Filled 2016-07-17: qty 1

## 2016-07-17 MED ORDER — MORPHINE SULFATE-NACL 0.5-0.9 MG/ML-% IV SOSY
PREFILLED_SYRINGE | INTRAVENOUS | Status: AC
  Filled 2016-07-17: qty 1

## 2016-07-17 MED ORDER — ZOLPIDEM TARTRATE 5 MG PO TABS: 5.0000 mg | ORAL_TABLET | Freq: Every evening | ORAL | Status: DC | PRN

## 2016-07-17 MED ORDER — SODIUM CHLORIDE 0.9% FLUSH: 3.0000 mL | INTRAVENOUS | Status: DC | PRN

## 2016-07-17 MED ORDER — SCOPOLAMINE 1 MG/3DAYS TD PT72
MEDICATED_PATCH | TRANSDERMAL | Status: DC | PRN
  Administered 2016-07-17: 1 via TRANSDERMAL

## 2016-07-17 MED ORDER — OXYTOCIN 10 UNIT/ML IJ SOLN
INTRAMUSCULAR | Status: AC
  Filled 2016-07-17: qty 4

## 2016-07-17 MED ORDER — DEXAMETHASONE SODIUM PHOSPHATE 4 MG/ML IJ SOLN
INTRAMUSCULAR | Status: AC
  Filled 2016-07-17: qty 1

## 2016-07-17 MED ORDER — ONDANSETRON HCL 4 MG/2ML IJ SOLN
INTRAMUSCULAR | Status: DC | PRN
  Administered 2016-07-17: 4 mg via INTRAVENOUS

## 2016-07-17 MED ORDER — ONDANSETRON HCL 4 MG/2ML IJ SOLN: 4.0000 mg | Freq: Three times a day (TID) | INTRAMUSCULAR | Status: DC | PRN

## 2016-07-17 MED ORDER — PHENYLEPHRINE 40 MCG/ML (10ML) SYRINGE FOR IV PUSH (FOR BLOOD PRESSURE SUPPORT)
PREFILLED_SYRINGE | INTRAVENOUS | Status: AC
  Filled 2016-07-17: qty 10

## 2016-07-17 MED ORDER — KETOROLAC TROMETHAMINE 30 MG/ML IJ SOLN: 30.0000 mg | Freq: Four times a day (QID) | INTRAMUSCULAR | Status: AC | PRN

## 2016-07-17 MED ORDER — PHENYLEPHRINE HCL 10 MG/ML IJ SOLN
INTRAMUSCULAR | Status: DC | PRN
  Administered 2016-07-17 (×2): 80 ug via INTRAVENOUS

## 2016-07-17 MED ORDER — HYDROMORPHONE HCL 2 MG PO TABS
4.0000 mg | ORAL_TABLET | ORAL | Status: DC | PRN
  Administered 2016-07-19 – 2016-07-20 (×3): 4 mg via ORAL
  Filled 2016-07-17 (×4): qty 2

## 2016-07-17 MED ORDER — KETOROLAC TROMETHAMINE 30 MG/ML IJ SOLN
30.0000 mg | Freq: Four times a day (QID) | INTRAMUSCULAR | Status: AC | PRN
  Administered 2016-07-17: 30 mg via INTRAMUSCULAR

## 2016-07-17 MED ORDER — DIPHENHYDRAMINE HCL 25 MG PO CAPS: 25.0000 mg | ORAL_CAPSULE | ORAL | Status: DC | PRN

## 2016-07-17 MED ORDER — MENTHOL 3 MG MT LOZG: 1.0000 | LOZENGE | OROMUCOSAL | Status: DC | PRN

## 2016-07-17 MED ORDER — FENTANYL CITRATE (PF) 100 MCG/2ML IJ SOLN
INTRAMUSCULAR | Status: AC
  Filled 2016-07-17: qty 2

## 2016-07-17 MED ORDER — NALBUPHINE HCL 10 MG/ML IJ SOLN: 5.0000 mg | INTRAMUSCULAR | Status: DC | PRN

## 2016-07-17 MED ORDER — FENTANYL CITRATE (PF) 100 MCG/2ML IJ SOLN
INTRAMUSCULAR | Status: DC | PRN
  Administered 2016-07-17: 10 ug via INTRATHECAL

## 2016-07-17 MED ORDER — NALOXONE HCL 2 MG/2ML IJ SOSY
1.0000 ug/kg/h | PREFILLED_SYRINGE | INTRAVENOUS | Status: DC | PRN
  Filled 2016-07-17: qty 2

## 2016-07-17 MED ORDER — SENNOSIDES-DOCUSATE SODIUM 8.6-50 MG PO TABS
2.0000 | ORAL_TABLET | ORAL | Status: DC
  Administered 2016-07-17 – 2016-07-20 (×3): 2 via ORAL
  Filled 2016-07-17 (×3): qty 2

## 2016-07-17 MED ORDER — DIBUCAINE 1 % RE OINT: 1.0000 "application " | TOPICAL_OINTMENT | RECTAL | Status: DC | PRN

## 2016-07-17 MED ORDER — PRENATAL MULTIVITAMIN CH
1.0000 | ORAL_TABLET | Freq: Every day | ORAL | Status: DC
  Administered 2016-07-18 – 2016-07-20 (×3): 1 via ORAL
  Filled 2016-07-17 (×3): qty 1

## 2016-07-17 MED ORDER — SOD CITRATE-CITRIC ACID 500-334 MG/5ML PO SOLN
ORAL | Status: AC
  Administered 2016-07-17: 30 mL via ORAL
  Filled 2016-07-17: qty 15

## 2016-07-17 MED ORDER — ACETAMINOPHEN 500 MG PO TABS
1000.0000 mg | ORAL_TABLET | Freq: Four times a day (QID) | ORAL | Status: AC
  Administered 2016-07-17 – 2016-07-18 (×4): 1000 mg via ORAL
  Filled 2016-07-17 (×4): qty 2

## 2016-07-17 MED ORDER — LACTATED RINGERS IV SOLN
INTRAVENOUS | Status: DC
  Administered 2016-07-17: 22:00:00 via INTRAVENOUS

## 2016-07-17 MED ORDER — MEPERIDINE HCL 25 MG/ML IJ SOLN
INTRAMUSCULAR | Status: DC | PRN
  Administered 2016-07-17 (×2): 12.5 mg via INTRAVENOUS

## 2016-07-17 MED ORDER — ONDANSETRON HCL 4 MG/2ML IJ SOLN
INTRAMUSCULAR | Status: AC
  Filled 2016-07-17: qty 2

## 2016-07-17 MED ORDER — METFORMIN HCL 500 MG PO TABS
500.0000 mg | ORAL_TABLET | Freq: Every day | ORAL | Status: DC
  Administered 2016-07-17 – 2016-07-20 (×4): 500 mg via ORAL
  Filled 2016-07-17 (×5): qty 1

## 2016-07-17 MED ORDER — COCONUT OIL OIL: 1.0000 "application " | TOPICAL_OIL | Status: DC | PRN

## 2016-07-17 MED ORDER — IBUPROFEN 600 MG PO TABS
600.0000 mg | ORAL_TABLET | Freq: Four times a day (QID) | ORAL | Status: DC
  Administered 2016-07-17 – 2016-07-20 (×13): 600 mg via ORAL
  Filled 2016-07-17 (×13): qty 1

## 2016-07-17 MED ORDER — SIMETHICONE 80 MG PO CHEW
80.0000 mg | CHEWABLE_TABLET | ORAL | Status: DC
  Administered 2016-07-17 – 2016-07-20 (×3): 80 mg via ORAL
  Filled 2016-07-17 (×3): qty 1

## 2016-07-17 MED ORDER — NALOXONE HCL 0.4 MG/ML IJ SOLN: 0.4000 mg | INTRAMUSCULAR | Status: DC | PRN

## 2016-07-17 MED ORDER — DEXAMETHASONE SODIUM PHOSPHATE 4 MG/ML IJ SOLN
INTRAMUSCULAR | Status: DC | PRN
  Administered 2016-07-17: 4 mg via INTRAVENOUS

## 2016-07-17 SURGICAL SUPPLY — 45 items
BARRIER ADHS 3X4 INTERCEED (GAUZE/BANDAGES/DRESSINGS) ×6 IMPLANT
BENZOIN TINCTURE PRP APPL 2/3 (GAUZE/BANDAGES/DRESSINGS) ×3 IMPLANT
CHLORAPREP W/TINT 26ML (MISCELLANEOUS) ×3 IMPLANT
CLAMP CORD UMBIL (MISCELLANEOUS) IMPLANT
CLOSURE STERI-STRIP 1/2X4 (GAUZE/BANDAGES/DRESSINGS) ×1
CLOSURE WOUND 1/2 X4 (GAUZE/BANDAGES/DRESSINGS)
CLOTH BEACON ORANGE TIMEOUT ST (SAFETY) ×3 IMPLANT
CLSR STERI-STRIP ANTIMIC 1/2X4 (GAUZE/BANDAGES/DRESSINGS) ×2 IMPLANT
CONTAINER PREFILL 10% NBF 15ML (MISCELLANEOUS) IMPLANT
DRAPE C SECTION CLR SCREEN (DRAPES) ×3 IMPLANT
DRSG OPSITE POSTOP 4X10 (GAUZE/BANDAGES/DRESSINGS) ×3 IMPLANT
ELECT REM PT RETURN 9FT ADLT (ELECTROSURGICAL) ×3
ELECTRODE REM PT RTRN 9FT ADLT (ELECTROSURGICAL) ×1 IMPLANT
EXTRACTOR VACUUM M CUP 4 TUBE (SUCTIONS) IMPLANT
EXTRACTOR VACUUM M CUP 4' TUBE (SUCTIONS)
GLOVE BIOGEL PI IND STRL 7.0 (GLOVE) ×2 IMPLANT
GLOVE BIOGEL PI INDICATOR 7.0 (GLOVE) ×4
GLOVE ECLIPSE 6.5 STRL STRAW (GLOVE) ×3 IMPLANT
GOWN STRL REUS W/TWL LRG LVL3 (GOWN DISPOSABLE) ×6 IMPLANT
KIT ABG SYR 3ML LUER SLIP (SYRINGE) IMPLANT
NEEDLE HYPO 22GX1.5 SAFETY (NEEDLE) ×3 IMPLANT
NEEDLE HYPO 25X5/8 SAFETYGLIDE (NEEDLE) IMPLANT
NS IRRIG 1000ML POUR BTL (IV SOLUTION) ×3 IMPLANT
PACK C SECTION WH (CUSTOM PROCEDURE TRAY) ×3 IMPLANT
PAD OB MATERNITY 4.3X12.25 (PERSONAL CARE ITEMS) ×3 IMPLANT
RTRCTR C-SECT PINK 25CM LRG (MISCELLANEOUS) IMPLANT
STRIP CLOSURE SKIN 1/2X4 (GAUZE/BANDAGES/DRESSINGS) IMPLANT
SUT CHROMIC GUT AB #0 18 (SUTURE) IMPLANT
SUT MNCRL 0 VIOLET CTX 36 (SUTURE) ×3 IMPLANT
SUT MON AB 2-0 SH 27 (SUTURE)
SUT MON AB 2-0 SH27 (SUTURE) IMPLANT
SUT MON AB 3-0 SH 27 (SUTURE)
SUT MON AB 3-0 SH27 (SUTURE) IMPLANT
SUT MON AB 4-0 PS1 27 (SUTURE) IMPLANT
SUT MONOCRYL 0 CTX 36 (SUTURE) ×6
SUT PLAIN 2 0 (SUTURE)
SUT PLAIN 2 0 XLH (SUTURE) IMPLANT
SUT PLAIN ABS 2-0 CT1 27XMFL (SUTURE) IMPLANT
SUT VIC AB 0 CT1 36 (SUTURE) ×6 IMPLANT
SUT VIC AB 2-0 CT1 27 (SUTURE) ×2
SUT VIC AB 2-0 CT1 TAPERPNT 27 (SUTURE) ×1 IMPLANT
SUT VIC AB 4-0 PS2 27 (SUTURE) IMPLANT
SYR CONTROL 10ML LL (SYRINGE) ×3 IMPLANT
TOWEL OR 17X24 6PK STRL BLUE (TOWEL DISPOSABLE) ×3 IMPLANT
TRAY FOLEY CATH SILVER 14FR (SET/KITS/TRAYS/PACK) IMPLANT

## 2016-07-17 NOTE — Lactation Note (Signed)
This note was copied from a baby's chart. Lactation Consultation Note  Baby is asleep in the bassinette and has just received formula.  Work with mom on hand expression but no colostrum was expressed.  She has a history of a lumpectomy on the right breast but much glandular tissue remains. She has an abundance of glandular tissue on the left side.  Encouraged her that one breast could make milk to feed [name].  Double electric breast set-up at bedside andmother reports she was instructed on use. Explained to her that it may take 24-48 hours before she is able to express colostrum. Mom understands that she should attempt to BF every 3 hours and follow-up with supplement.  Encouraged her to call for latching assistance.  Patient Name: [name] [id number] Date: 07/17/2016 Reason for consult: Follow-up assessment   Maternal Data Has patient been taught Hand Expression?: Yes Does the patient have breastfeeding experience prior to this delivery?: No (milk did not come to volume)  Feeding Feeding Type: Formula Nipple Type: Slow - flow  LATCH Score/Interventions                      Lactation Tools Discussed/Used     Consult Status Consult Status: Follow-up Date: 07/18/16 Follow-up type: In-patient    Van Clines 07/17/2016, 3:26 PM

## 2016-07-17 NOTE — Progress Notes (Signed)
Notified Dr. Garwin Brothers of elevated blood pressures in PACU. No new orders at this time. Johnette Abraham Tim Wilhide, RN

## 2016-07-17 NOTE — Brief Op Note (Signed)
07/16/2016 - 07/17/2016  4:07 AM  PATIENT:  [name]  40 y.o. female  PRE-OPERATIVE DIAGNOSIS:  Active labor, PROM, previous C/S, IUP @ 36 4/7 weeks, GBS cx positive Class A2/B GDM  POST-OPERATIVE DIAGNOSIS:  same  PROCEDURE:  Repeat Cesarean section, kerr hysterotomy  SURGEON:  Surgeon(s) and Role:    * Servando Salina, MD - Primary  PHYSICIAN ASSISTANT:   ASSISTANTS: Artelia Laroche, CNM   ANESTHESIA:   spinal Findings: nl tubes and ovary, live female ROP, loop of cord around left leg, ant placenta EBL:  Total I/O In: 700 [I.V.:700] Out: 750 [Urine:150; Blood:600]  BLOOD ADMINISTERED:none  DRAINS: none   LOCAL MEDICATIONS USED:  LIDOCAINE   SPECIMEN:  Source of Specimen:  placenta  DISPOSITION OF SPECIMEN:  PATHOLOGY  COUNTS:  YES  TOURNIQUET:  * No tourniquets in log *  DICTATION: .Other Dictation: Dictation Number [id number]  PLAN OF CARE: Admit to inpatient   PATIENT DISPOSITION:  PACU - hemodynamically stable.   Delay start of Pharmacological VTE agent (>24hrs) due to surgical blood loss or risk of bleeding: no

## 2016-07-17 NOTE — Lactation Note (Signed)
This note was copied from a baby's chart. Lactation Consultation Note  Mom reports that her milk did not come volume with her first baby but reports breasts changes with this baby.  She has had a cesarean, is DM, and has HTN. Explained late preterm policy to mother and she is agreeable to it. RN to set-up breast pump. Lactation to follow-up later today. Patient Name: [name] Today's Date: 07/17/2016 Reason for consult: Initial assessment   Maternal Data Has patient been taught Hand Expression?: No (relatives entered the room) Does the patient have breastfeeding experience prior to this delivery?: No (milk did not come to volume)  Feeding Feeding Type: Formula Nipple Type: Slow - flow  LATCH Score/Interventions                      Lactation Tools Discussed/Used     Consult Status Consult Status: Follow-up Date: 07/17/16 Follow-up type: In-patient    Van Clines 07/17/2016, 12:31 PM

## 2016-07-17 NOTE — Consult Note (Signed)
Neonatology Note:   Attendance at C-section:    I was asked by Dr. Garwin Brothers to attend this repeat C/S at 36 4/[redacted] weeks GA due to SROM and previous C-section, in labor. The mother is a G5P1A3 A pos, GBS pos with GDM, on Metformin and Glyburide. ROM 4 hours prior to delivery, fluid clear. Mother got 1 dose of Pen G 3 hours before delivery and she was afebrile. Infant vigorous with good spontaneous cry and tone. Delayed cord clamping was done. Needed only minimal bulb suctioning. Ap 9/9. Lungs clear to ausc in DR. To CN to care of Pediatrician.   Real Cons, MD

## 2016-07-17 NOTE — Progress Notes (Signed)
Pt has advanced her cervical exam to 4/50/-2 resulting in need to proceed with R C/S at this time. OR notified

## 2016-07-17 NOTE — Anesthesia Postprocedure Evaluation (Signed)
Anesthesia Post Note  Patient: SHAREEKA CERNOSEK  Procedure(s) Performed: Procedure(s) (LRB): CESAREAN SECTION (N/A)  Patient location during evaluation: Mother Baby Anesthesia Type: Spinal Level of consciousness: oriented and awake and alert Pain management: pain level controlled Vital Signs Assessment: post-procedure vital signs reviewed and stable Respiratory status: spontaneous breathing and respiratory function stable Cardiovascular status: blood pressure returned to baseline and stable Postop Assessment: no headache and no backache Anesthetic complications: no     Last Vitals:  Vitals:   07/17/16 0612 07/17/16 0653  BP: 137/80 126/81  Pulse: 80 76  Resp: 18 18  Temp: 36.7 C 36.4 C    Last Pain:  Vitals:   07/17/16 0653  TempSrc: Oral  PainSc: 0-No pain   Pain Goal:                 Riki Sheer

## 2016-07-17 NOTE — Addendum Note (Signed)
Addendum  created 07/17/16 0754 by Riki Sheer, CRNA   Sign clinical note

## 2016-07-17 NOTE — Anesthesia Preprocedure Evaluation (Signed)
Anesthesia Evaluation  Patient identified by MRN, date of birth, ID band Patient awake    Reviewed: Allergy & Precautions, NPO status , Patient's Chart, lab work & pertinent test results  Airway Mallampati: III  TM Distance: >3 FB Neck ROM: Full    Dental  (+) Teeth Intact, Dental Advisory Given   Pulmonary neg pulmonary ROS,    Pulmonary exam normal breath sounds clear to auscultation       Cardiovascular hypertension, Normal cardiovascular exam Rhythm:Regular Rate:Normal     Neuro/Psych negative neurological ROS  negative psych ROS   GI/Hepatic negative GI ROS, Neg liver ROS,   Endo/Other  diabetes, Gestational, Oral Hypoglycemic AgentsMorbid obesity  Renal/GU negative Renal ROS     Musculoskeletal negative musculoskeletal ROS (+)   Abdominal   Peds  Hematology negative hematology ROS (+) Plt 228k   Anesthesia Other Findings Day of surgery medications reviewed with the patient.  Reproductive/Obstetrics (+) Pregnancy H/o C-section x1                             Anesthesia Physical Anesthesia Plan  ASA: III and emergent  Anesthesia Plan: Spinal   Post-op Pain Management:    Induction:   Airway Management Planned:   Additional Equipment:   Intra-op Plan:   Post-operative Plan:   Informed Consent: I have reviewed the patients History and Physical, chart, labs and discussed the procedure including the risks, benefits and alternatives for the proposed anesthesia with the patient or authorized representative who has indicated his/her understanding and acceptance.   Dental advisory given  Plan Discussed with: CRNA, Anesthesiologist and Surgeon  Anesthesia Plan Comments: (Discussed risks and benefits of and differences between spinal and general. Discussed risks of spinal including headache, backache, failure, bleeding, infection, and nerve damage. Patient consents to spinal.  Questions answered. Coagulation studies and platelet count acceptable.)        Anesthesia Quick Evaluation

## 2016-07-17 NOTE — Anesthesia Procedure Notes (Signed)
Spinal  Patient location during procedure: OR Start time: 07/17/2016 3:03 AM End time: 07/17/2016 3:06 AM Staffing Anesthesiologist: Catalina Gravel Performed: anesthesiologist  Preanesthetic Checklist Completed: patient identified, surgical consent, pre-op evaluation, timeout performed, IV checked, risks and benefits discussed and monitors and equipment checked Spinal Block Patient position: sitting Prep: site prepped and draped and DuraPrep Patient monitoring: continuous pulse ox and blood pressure Approach: midline Location: L3-4 Injection technique: single-shot Needle Needle type: Pencan  Needle gauge: 25 G Needle length: 9 cm Assessment Sensory level: T4 Additional Notes Functioning IV was confirmed and monitors were applied. Sterile prep and drape, including hand hygiene, mask and sterile gloves were used. The patient was positioned and the spine was prepped. The skin was anesthetized with lidocaine.  Free flow of clear CSF was obtained prior to injecting local anesthetic into the CSF.  The spinal needle aspirated freely following injection.  The needle was carefully withdrawn.  The patient tolerated the procedure well. Consent was obtained prior to procedure with all questions answered and concerns addressed. Risks including but not limited to bleeding, infection, nerve damage, paralysis, failed block, inadequate analgesia, allergic reaction, high spinal, itching and headache were discussed and the patient wished to proceed.   Hoy Morn, MD

## 2016-07-17 NOTE — H&P (Signed)
[name] is a 40 y.o. female presenting @ 36 4/[redacted] weeks gestation  with  C/o SROM clear fluid@ 9 pm. (+) irreg ctx. PNC complicated by  Class A2 GDM controlled on glyburide,metformin. Last sono 12/8 showed EFW 7lbs. Pt ate @ 9pm [place]. (+) GB. sched for repeat C/S 12/28  OB History    Gravida Para Term Preterm AB Living   5 1 1   3 1    SAB TAB Ectopic Multiple Live Births   3             Past Medical History:  Diagnosis Date  . Diabetes mellitus without complication (HCC)    gestational  . Hypertension    Past Surgical History:  Procedure Laterality Date  . BREAST LUMPECTOMY    . BREAST SURGERY    . CESAREAN SECTION     Family History: family history is not on file. Social History:  reports that she has never smoked. She does not have any smokeless tobacco history on file. She reports that she drinks alcohol. She reports that she does not use drugs.     Maternal Diabetes: Yes:  Diabetes Type:  Insulin/Medication controlled Genetic Screening: Normal Maternal Ultrasounds/Referrals: Normal Fetal Ultrasounds or other Referrals:  Fetal echo nl Maternal Substance Abuse:  No Significant Maternal Medications:  Meds include: Other:  metformin, glyburide Significant Maternal Lab Results:  Lab values include: Group B Strep positive Other Comments:  None  Review of Systems  Eyes: Negative for blurred vision.  Cardiovascular: Positive for leg swelling.  Gastrointestinal: Negative for nausea.   Maternal Medical History:  Reason for admission: Nausea.    Dilation: 1.5 Effacement (%): Thick Station: -3 Exam by:: Darrol Poke RN Blood pressure 148/86, pulse 74, temperature 98.1 F (36.7 C), temperature source Oral, resp. rate 19, height 5' 3.5" (1.613 m), weight 104.8 kg (231 lb). Maternal Exam:  Uterine Assessment: Contraction strength is mild.  Contraction frequency is irregular.   Abdomen: Patient reports no abdominal tenderness. Estimated fetal weight is 7lb.    Fetal presentation: vertex  Introitus: Normal vulva. Amniotic fluid character: clear.  Pelvis: adequate for delivery.   Cervix: Cervix evaluated by digital exam.     Physical Exam  Constitutional: She is oriented to person, place, and time. She appears well-developed and well-nourished.  HENT:  Head: Atraumatic.  Eyes: EOM are normal.  Neck: Neck supple.  Cardiovascular: Regular rhythm.   Respiratory: Breath sounds normal.  GI: Soft.  Musculoskeletal: She exhibits edema.  Neurological: She is alert and oriented to person, place, and time.  Skin: Skin is warm and dry.  Psychiatric: She has a normal mood and affect.    Prenatal labs: ABO, Rh:  A positive Antibody:  neg Rubella:  Immune RPR:   NR HBsAg:   neg HIV:   Neg GBS:  positive   Tracing: baseline 150 (+) accels  occ early decel irreg ctx Assessment/Plan: SROM GBS cx (+) GDM Class A2 GDM IUP@ 36 4/7 weeks Previous C/S P) admit routine labs.  Proceed with repeat C/S. Risk of surgery reviewed including infection, bleeding, poss need for blood transfusion and its risk( HIV, hepatitis, fever/rash), internal scar tissue, injury to surrounding organ structures. All ? Answered. Antibiotics prophylaxis  Heston Widener A 07/17/2016, 12:24 AM

## 2016-07-17 NOTE — Progress Notes (Signed)
Patient ID: [name], female   DOB: 1975/09/08, 40 y.o.   MRN: [id number] Subjective: S/P Elective Repeat Cesarean Delivery d/t SROM / Active labor, PROM, previous C/S / Class A2/B GDM  POD# 0 Information for the patient's newborn:  Melanie, Warren [id number]  female  / circ planning  Reports feeling well. Feeding: breast and bottle Patient reports tolerating PO.  Breast symptoms: small amount of colostrum Pain controlled with ibuprofen (OTC) and spinal medications Denies HA/SOB/C/P/N/V/dizziness. Flatus absent. No BM. She reports vaginal bleeding as normal, without clots.  She is ambulating, urinating without difficult.     Objective:   VS:  Vitals:   07/17/16 0612 07/17/16 0653 07/17/16 0815 07/17/16 0915  BP: 137/80 126/81 135/77 124/72  Pulse: 80 76 72 70  Resp: 18 18 18 19   Temp: 98.1 F (36.7 C) 97.6 F (36.4 C) 97.6 F (36.4 C) 97.9 F (36.6 C)  TempSrc: Oral Oral Oral Oral  SpO2: 97% 96% 97% 97%  Weight:      Height:         Intake/Output Summary (Last 24 hours) at 07/17/16 1112 Last data filed at 07/17/16 0532  Gross per 24 hour  Intake              800 ml  Output              975 ml  Net             -175 ml        Recent Labs  07/16/16 2345 07/17/16 0922  WBC 6.9 14.2*  HGB 12.2 11.4*  HCT 36.0 33.6*  PLT 228 224     Blood type: --/--/A POS (12/10 2345)  Rubella: Immune (06/09 0000)     Physical Exam:   General: alert, cooperative, fatigued and no distress  CV: Regular rate and rhythm, S1S2 present or without murmur or extra heart sounds  Resp: clear  Abdomen: soft, nontender, normal bowel sounds  Incision: clean, dry, intact and skin well-approximated with suture  Uterine Fundus: firm, umbilicus even, nontender  Lochia: minimal  Ext: extremities normal, atraumatic, no cyanosis or edema, Homans sign is negative, no sign of DVT and no edema, redness or tenderness in the calves or thighs   Assessment/Plan: 40 y.o.   POD# 0.  S/P  Cesarean Delivery.  Indications: Elective Repeat Cesarean Delivery d/t SROM / Active labor, PROM, previous C/S / Class A2/B GDM                Principal Problem:   Postpartum care following cesarean delivery Indication: Repeat, SROM (12/11) Active Problems:   Previous cesarean delivery affecting pregnancy, antepartum   GDM, class A2  Doing well, stable.               Regular diet as tolerated D/C foley & IV per unit protocol Ambulate Routine post-op care  Graceann Congress, MSN, CNM 07/17/2016, 11:12 AM

## 2016-07-17 NOTE — Anesthesia Postprocedure Evaluation (Signed)
Anesthesia Post Note  Patient: [name]  Procedure(s) Performed: Procedure(s) (LRB): CESAREAN SECTION (N/A)  Patient location during evaluation: PACU Anesthesia Type: Spinal Level of consciousness: oriented and awake and alert Pain management: pain level controlled Vital Signs Assessment: post-procedure vital signs reviewed and stable Respiratory status: spontaneous breathing, respiratory function stable and patient connected to nasal cannula oxygen Cardiovascular status: blood pressure returned to baseline and stable Postop Assessment: no headache, no backache, spinal receding, no signs of nausea or vomiting and patient able to bend at knees Anesthetic complications: no     Last Vitals:  Vitals:   07/17/16 0558 07/17/16 0612  BP:  137/80  Pulse:  80  Resp: 20 18  Temp:  36.7 C    Last Pain:  Vitals:   07/17/16 0612  TempSrc: Oral  PainSc:    Pain Goal:                 Catalina Gravel

## 2016-07-17 NOTE — Transfer of Care (Signed)
Immediate Anesthesia Transfer of Care Note  Patient: [name]  Procedure(s) Performed: Procedure(s): CESAREAN SECTION (N/A)  Patient Location: PACU  Anesthesia Type:Spinal  Level of Consciousness: awake, alert  and oriented  Airway & Oxygen Therapy: Patient Spontanous Breathing  Post-op Assessment: Report given to RN and Post -op Vital signs reviewed and stable  Post vital signs: Reviewed and stable  Last Vitals:  Vitals:   07/16/16 2354 07/16/16 2359  BP: 148/86 148/86  Pulse: 74 74  Resp:    Temp:      Last Pain:  Vitals:   07/17/16 0215  TempSrc:   PainSc: 10-Worst pain ever         Complications: No apparent anesthesia complications

## 2016-07-18 ENCOUNTER — Encounter (HOSPITAL_COMMUNITY): Payer: Self-pay | Admitting: Obstetrics and Gynecology

## 2016-07-18 LAB — GLUCOSE, CAPILLARY: Glucose-Capillary: 96 mg/dL (ref 65–99)

## 2016-07-18 NOTE — Op Note (Deleted)
  The note originally documented on this encounter has been moved the the encounter in which it belongs.  

## 2016-07-18 NOTE — Op Note (Signed)
NAMETRAMANH, STJAMES NO.:  [id number]  MEDICAL RECORD NO.:  [id number]  LOCATION:                                 FACILITY:  PHYSICIAN:  Servando Salina, M.D.DATE OF BIRTH:  [birth year]  DATE OF PROCEDURE:  07/17/2016 DATE OF DISCHARGE:                              OPERATIVE REPORT   PREOPERATIVE DIAGNOSES:  Active labor, preterm rupture of membranes, intrauterine gestation at 43 and 4/7 weeks, class A2 gestational diabetes/group B strep positive.  PROCEDURE:  Repeat cesarean section, Kerr hysterotomy.  POSTOPERATIVE DIAGNOSES:  Active labor, preterm rupture of membranes, intrauterine gestation at 41 and 4/7 weeks, class A2 gestational diabetes/group B strep positive.  ANESTHESIA:  Spinal.  SURGEONS:  Servando Salina, M.D.  ASSISTANT:  Artelia Laroche, CNM.  DESCRIPTION OF PROCEDURE:  Under adequate spinal anesthesia, the patient was placed in the supine position with a left lateral tilt.  She was sterilely prepped and draped in usual fashion.  Indwelling Foley catheter was sterilely placed.  Traxi retractor was used.  Marcaine 0.25% was injected along the previous Pfannenstiel skin incision site. Pfannenstiel skin incision was then made, carried down to the rectus fascia.  Rectus fascia was opened transversely.  Rectus fascia was bluntly and sharply dissected off the rectus muscle in superior and inferior fashion.  The rectus muscle was split in the midline.  The parietal peritoneum was entered bluntly and extended.  A self-retaining Alexis retractor was then placed.  Adhesions were noted.  The vesicouterine peritoneum was opened transversely and the bladder gently displaced inferiorly.  A curvilinear low transverse uterine incision was then made and extended with bandage scissors.  Copious amniotic fluid was noted. Subsequent delivery of a live female infant from the right occiput position with loop of cord around leg was accomplished. Cord was clamped  cut and baby transferred to awaiting pediatrician who assigned Apgar 9 and 9 at 1 and 5 minutes respectively. The placenta was manually removed, intact sent to pathology. The cavity was cleaned of debris and no extension noted.  The uterus was closed in two layers, the first layer with 0 Monocryl running lock stitch, second layer was imbricated with 0 Monocryl suture.  Normal tubes and ovaries were noted bilaterally.  The Alexis retractor was removed.  The abdomen was irrigated and suctioned of debris.  Interceed was placed over the lower uterine incision in an inverted T fashion.  The parietal peritoneum was closed with 2-0 Vicryl.  The rectus fascia was closed with 0 Vicryl x2.  The subcutaneous area was irrigated.  Small bleeders cauterized.  Interrupted 2-0 plain sutures placed and the skin approximated using 4-0 Vicryl subcuticular closure.  Steri-Strips and benzoin were then placed.  SPECIMENS:  Placenta sent to Pathology.  ESTIMATED BLOOD LOSS:  600 mL.  FLUIDS:  700 mL.  URINE OUTPUT:  150 mL clear yellow urine.  SPONGE AND INSTRUMENT COUNT:  Counts x2 were correct.  COMPLICATION:  None.  The patient tolerated the procedure well and was transferred to recovery in stable condition.     Servando Salina, M.D.   ______________________________ Servando Salina, M.D.    [place]/MEDQ  D:  07/17/2016  T:  07/17/2016  Job:  [id number]

## 2016-07-18 NOTE — Progress Notes (Signed)
Subjective: POD# 1 Information for the patient's newborn:  [name], Jansson [id number]  female   circ planned Baby name: [name]  Reports feeling tired but well. Feeding: bottle Patient reports tolerating PO.  Breast symptoms: no milk noted Pain controlled with PO meds Denies HA/SOB/C/P/N/V/dizziness. Flatus minimal. She reports vaginal bleeding as normal, without clots.  She is ambulating, urinating without difficulty.     Objective:   VS:    Vitals:   07/17/16 1945 07/17/16 2348 07/18/16 0351 07/18/16 0552  BP: 127/80 127/65 120/76 120/73  Pulse: 72 72 74 75  Resp: 16 16 16 18   Temp: 98 F (36.7 C) 98.2 F (36.8 C) 97.9 F (36.6 C) 98 F (36.7 C)  TempSrc: Oral Oral Oral Oral  SpO2: 100% 100% 96%   Weight:      Height:         Intake/Output Summary (Last 24 hours) at 07/18/16 1117 Last data filed at 07/18/16 0300  Gross per 24 hour  Intake          1572.92 ml  Output             1450 ml  Net           122.92 ml        Recent Labs  07/16/16 2345 07/17/16 0922  WBC 6.9 14.2*  HGB 12.2 11.4*  HCT 36.0 33.6*  PLT 228 224     Blood type: --/--/A POS (12/10 2345)  Rubella: Immune (06/09 0000)     Physical Exam:  General: alert, cooperative and no distress CV: Regular rate and rhythm Resp: clear Abdomen: + BS x 4Q, moderate gast dist, NT Incision: intact and serous and 20% of dressing drainage present Uterine Fundus: firm, below umbilicus, nontender Lochia: minimal Ext: no edema, redness or tenderness in the calves or thighs      Assessment/Plan: 40 y.o.   POD# 1. [id number]                  Principal Problem:   Postpartum care following cesarean delivery Indication: Repeat, SROM (12/11) Active Problems:   GDM, class A2   Doing well, stable.               Advance diet as tolerated Encourage rest when baby rests Breastfeeding support, would like to breastfeed but did not have any milk last time. Encouraged to work w/ LC and supplement formula  after feeds.  Encourage to ambulate Routine post-op care  Juliene Pina, CNM, MSN 07/18/2016, 11:17 AM

## 2016-07-19 LAB — CBC
HCT: 27.5 % — ABNORMAL LOW (ref 36.0–46.0)
Hemoglobin: 9.6 g/dL — ABNORMAL LOW (ref 12.0–15.0)
MCH: 26.9 pg (ref 26.0–34.0)
MCHC: 34.9 g/dL (ref 30.0–36.0)
MCV: 77 fL — ABNORMAL LOW (ref 78.0–100.0)
Platelets: 224 10*3/uL (ref 150–400)
RBC: 3.57 MIL/uL — ABNORMAL LOW (ref 3.87–5.11)
RDW: 17.1 % — ABNORMAL HIGH (ref 11.5–15.5)
WBC: 8.9 10*3/uL (ref 4.0–10.5)

## 2016-07-19 LAB — COMPREHENSIVE METABOLIC PANEL
ALT: 29 U/L (ref 14–54)
AST: 30 U/L (ref 15–41)
Albumin: 2.6 g/dL — ABNORMAL LOW (ref 3.5–5.0)
Alkaline Phosphatase: 86 U/L (ref 38–126)
Anion gap: 7 (ref 5–15)
BUN: 11 mg/dL (ref 6–20)
CO2: 21 mmol/L — ABNORMAL LOW (ref 22–32)
Calcium: 8.4 mg/dL — ABNORMAL LOW (ref 8.9–10.3)
Chloride: 104 mmol/L (ref 101–111)
Creatinine, Ser: 0.64 mg/dL (ref 0.44–1.00)
GFR calc Af Amer: >60 mL/min (ref >60)
GFR calc non Af Amer: >60 mL/min (ref >60)
Glucose, Bld: 78 mg/dL (ref 65–99)
Potassium: 3.8 mmol/L (ref 3.5–5.1)
Sodium: 132 mmol/L — ABNORMAL LOW (ref 135–145)
Total Bilirubin: 0.5 mg/dL (ref 0.3–1.2)
Total Protein: 5.8 g/dL — ABNORMAL LOW (ref 6.5–8.1)

## 2016-07-19 LAB — GLUCOSE, CAPILLARY: Glucose-Capillary: 144 mg/dL — ABNORMAL HIGH (ref 65–99)

## 2016-07-19 MED ORDER — METFORMIN HCL 500 MG PO TABS
1000.0000 mg | ORAL_TABLET | Freq: Every day | ORAL | Status: DC
  Administered 2016-07-20: 1000 mg via ORAL
  Filled 2016-07-19 (×2): qty 2

## 2016-07-19 NOTE — Progress Notes (Signed)
Reviewed [facility] labs / BP - stable BS improved after AM metformin dose  Circumcision tomorrow per Dr Garwin Brothers DC tomorrow planned  Artelia Laroche CNM [facility]

## 2016-07-19 NOTE — Lactation Note (Signed)
This note was copied from a baby's chart. Lactation Consultation Note  Patient Name: [name] [id number] Date: 07/19/2016 Reason for consult: Follow-up assessment;Infant < 6lbs (per mom has decided to bottle feed formula , baby has already formula with a bottle / see LC note ) Baby is 23 hours old. LC explained to mom breast feeding has a lot of benefits and she has options. Option keep latching , add post pumping, and when the  Milk comes in may be a different situation and until the milk comes in to supplement afterwards. LC explained to mom we are here to support her which ever  Choice she makes and if she changes her mind to let us know.  Per mom concerned with her 1st baby her milk never came in and she was on MagSo4. This pregnancy just elevated B/P.    Maternal Data    Feeding Feeding Type: Bottle Fed - Formula Nipple Type: Slow - flow  LATCH Score/Interventions                      Lactation Tools Discussed/Used     Consult Status Consult Status: Complete Date: 07/19/16    Myer Haff 07/19/2016, 4:22 PM

## 2016-07-19 NOTE — Progress Notes (Signed)
POSTOPERATIVE DAY # 2 S/P c/s   S:        Denies headache, dizziness, nausea, epigastric  pain, or vision changes.   Incision pain, relief with motrin and percocet,   + gas, no BM  She wants to go home.    "Did not take metformin last night because her BS  was 95."  Requests circumcision      O:   VS: BP (!) 137/92 (BP Location: Left Arm) Comment: Reported to Rn  Pulse 79   Temp 98.3 F (36.8 C) (Oral)   Resp 18   Ht 5' 3.5" (1.613 m)   Wt 104.8 kg (231 lb)   SpO2 96%   Breastfeeding? Unknown   BMI 40.28 kg/m    LABS:               Recent Labs  07/16/16 2345 07/17/16 0922  WBC 6.9 14.2*  HGB 12.2 11.4*  PLT 228 224               Bloodtype: --/--/A POS (12/10 2345)  Rubella: Immune (06/09 0000)                     Previous blood sugars documented 146, 158, 164, 96.  None today.             Physical Exam:           Appears to be in pain, guarding abd, no distress,   Heart: S1, S2, no murmurs  Lungs: Clear, bilaterally  Abd: + BS x4, incision covered with honeycomb dressing, dressing has 20% dark drainage with no sign of new bleeding, displaced on bottom left corner, Fundus U-1, tender to touch,   Perineum: intact  Lochia: light  Extermities: no edema, no clonus       A:        POD # 2 c/s for repeat in labor  BP elevation with hx of preeclampsia  Uncontrolled bs with GMA2    P:        Routine postoperative care   CBG achs, possible dose adjustment prior to d/c   based on results.  Monitor bp q4h for 24hr, consider labs   If stable, d/c this pm.  Newborn circ today  Crista Luria, SNM    Supervised by:  Artelia Laroche CNM, MSN, [place] 07/19/2016, 10:06 AM

## 2016-07-20 DIAGNOSIS — R030 Elevated blood-pressure reading, without diagnosis of hypertension: Secondary | ICD-10-CM | POA: Diagnosis not present

## 2016-07-20 LAB — GLUCOSE, CAPILLARY
Glucose-Capillary: 207 mg/dL — ABNORMAL HIGH (ref 65–99)
Glucose-Capillary: 92 mg/dL (ref 65–99)

## 2016-07-20 MED ORDER — IBUPROFEN 600 MG PO TABS
600.0000 mg | ORAL_TABLET | Freq: Four times a day (QID) | ORAL | 0 refills | Status: DC
Start: 2016-07-20 — End: 2017-09-27

## 2016-07-20 MED ORDER — SIMETHICONE 80 MG PO CHEW: 80.0000 mg | CHEWABLE_TABLET | Freq: Three times a day (TID) | ORAL | 0 refills | Status: DC

## 2016-07-20 MED ORDER — SENNOSIDES-DOCUSATE SODIUM 8.6-50 MG PO TABS: 2.0000 | ORAL_TABLET | ORAL | Status: DC

## 2016-07-20 MED ORDER — HYDROMORPHONE HCL 4 MG PO TABS: 4.0000 mg | ORAL_TABLET | ORAL | 0 refills | Status: DC | PRN

## 2016-07-20 NOTE — Progress Notes (Signed)
Subjective: POD# 3 Information for the patient's newborn:  Areliz, Savely [id number]  female   circ completed Baby name: [name]  Reports feeling well. Ready for DC home. Feeding: bottle Patient reports tolerating PO.  Breast symptoms: none Pain controlled with PO meds Denies HA/SOB/C/P/N/V/dizziness. Flatus present, no BM. She reports vaginal bleeding as normal, without clots.  She is ambulating, urinating without difficulty.     Objective:   VS:    Vitals:   07/19/16 1404 07/19/16 1826 07/20/16 0530 07/20/16 0932  BP: 137/83 139/83 123/79 134/72  Pulse: 78 81 83 83  Resp:  18 18   Temp:  98 F (36.7 C) 98.6 F (37 C)   TempSrc:  Axillary Oral   SpO2:      Weight:      Height:        No intake or output data in the 24 hours ending 07/20/16 1027      Recent Labs  07/19/16 1317  WBC 8.9  HGB 9.6*  HCT 27.5*  PLT 224  Results for ZINNIA, DUMOUCHEL (MRN [id number]) as of 07/20/2016 10:28  Ref. Range 07/18/2016 23:33 07/19/2016 10:54 07/19/2016 13:17 07/20/2016 00:09 07/20/2016 08:03  Glucose-Capillary Latest Ref Range: 65 - 99 mg/dL 96 144 (H)  207 (H) 92    Blood type: --/--/A POS (12/10 2345)  Rubella: Immune (06/09 0000)     Physical Exam:  General: alert, cooperative and no distress Abdomen: soft, nontender, normal bowel sounds Incision: clean, dry and intact Uterine Fundus: firm, below umbilicus, nontender Lochia: minimal Ext: edema +1 pedal and Homans sign is negative, no sign of DVT      Assessment/Plan: 40 y.o.   POD# 3. [id number]                  Principal Problem:   Postpartum care following cesarean delivery (12/11) Active Problems:   GDM, class A2  - elevated BCG overnight, will continue Metformin as established, home monitoring BCG   Cesarean delivery delivered - repeat with SROM on admit   Transient elevated blood pressure  - BP stable overnight, normotensive, no neural s/s of PEC  Doing well, stable.    DC home with WOB  instructions F/U PP visit 6 wks and PRN  Juliene Pina, CNM, MSN 07/20/2016, 10:27 AM

## 2016-07-20 NOTE — Discharge Summary (Signed)
OB Discharge Summary     Patient Name: [name] DOB: 22-Aug-1975 MRN: [id number]  Date of admission: 07/16/2016 Delivering MD: Shunta Mclaurin   Date of discharge: 07/20/2016  Admitting diagnosis: PROM, class A2 GDM, Previous C/S, Active labor, GBS cx positive Intrauterine pregnancy: [redacted]w[redacted]d     Secondary diagnosis:  Principal Problem:   Postpartum care following cesarean delivery (12/11) Active Problems:   GDM, class A2   Cesarean delivery delivered - repeat with SROM on admit   Transient elevated blood pressure      Discharge diagnosis: Preterm Pregnancy Delivered and GDM A2                                                                                                Post partum procedures:none   Complications: None  Hospital course:  Onset of Labor With Unplanned C/S  40 y.o. yo [id number] at [redacted]w[redacted]d was admitted in active labor on 07/16/2016. Patient had a labor course significant for premature rupture of membranes. Membrane Rupture Time/Date: 10:55 PM ,07/16/2016   The patient went for cesarean section due to active labor, previous Cesarean section and delivered a Viable infant,07/17/2016  Details of operation can be found in separate operative note. Patient had an uncomplicated postpartum course.  She is ambulating,tolerating a regular diet, passing flatus, and urinating well.  Patient is discharged home in stable condition 07/20/16.   Physical exam Vitals:   07/19/16 1404 07/19/16 1826 07/20/16 0530 07/20/16 0932  BP: 137/83 139/83 123/79 134/72  Pulse: 78 81 83 83  Resp:  18 18   Temp:  98 F (36.7 C) 98.6 F (37 C)   TempSrc:  Axillary Oral   SpO2:      Weight:      Height:       General: alert, cooperative and no distress Lochia: appropriate Uterine Fundus: firm Incision: Healing well with no significant drainage, No significant erythema, Dressing is clean, dry, and intact DVT Evaluation: No cords or calf tenderness. Calf/Ankle edema is  present Labs: Lab Results  Component Value Date   WBC 8.9 07/19/2016   HGB 9.6 (L) 07/19/2016   HCT 27.5 (L) 07/19/2016   MCV 77.0 (L) 07/19/2016   PLT 224 07/19/2016   CMP Latest Ref Rng & Units 07/19/2016  Glucose 65 - 99 mg/dL 78  BUN 6 - 20 mg/dL 11  Creatinine 0.44 - 1.00 mg/dL 0.64  Sodium 135 - 145 mmol/L 132(L)  Potassium 3.5 - 5.1 mmol/L 3.8  Chloride 101 - 111 mmol/L 104  CO2 22 - 32 mmol/L 21(L)  Calcium 8.9 - 10.3 mg/dL 8.4(L)  Total Protein 6.5 - 8.1 g/dL 5.8(L)  Total Bilirubin 0.3 - 1.2 mg/dL 0.5  Alkaline Phos 38 - 126 U/L 86  AST 15 - 41 U/L 30  ALT 14 - 54 U/L 29    Discharge instruction: per After Visit Summary and "Baby and Me Booklet".  After visit meds:    Medication List    TAKE these medications   HYDROmorphone 4 MG tablet Commonly known as:  DILAUDID Take 1 tablet (4 mg total) by  mouth every 4 (four) hours as needed for severe pain.   ibuprofen 600 MG tablet Commonly known as:  ADVIL,MOTRIN Take 1 tablet (600 mg total) by mouth every 6 (six) hours.   metFORMIN 500 MG tablet Commonly known as:  GLUCOPHAGE Take 500-1,000 mg by mouth 2 (two) times daily. Take 500mg  in the morning and 1000mg  in the evening   multivitamin-prenatal 27-0.8 MG Tabs tablet Take 1 tablet by mouth daily at 12 noon.   senna-docusate 8.6-50 MG tablet Commonly known as:  Senokot-S Take 2 tablets by mouth daily. Start taking on:  07/21/2016   simethicone 80 MG chewable tablet Commonly known as:  MYLICON Chew 1 tablet (80 mg total) by mouth 3 (three) times daily after meals.       Diet: carb modified diet  Activity: Advance as tolerated. Pelvic rest for 6 weeks.   Outpatient follow up:6 weeks Follow up Appt:No future appointments. Follow up Visit:No Follow-up on file.  Postpartum contraception: Not Discussed  Newborn Data: Live born female [name] Birth Weight: 7 lb 10.9 oz (3485 g) APGAR: 9, 9  Baby Feeding: Bottle Disposition:home with  mother   07/20/2016 Juliene Pina, CNM

## 2016-08-03 ENCOUNTER — Encounter (HOSPITAL_COMMUNITY): Admission: RE | Payer: Self-pay | Source: Ambulatory Visit

## 2016-08-03 ENCOUNTER — Inpatient Hospital Stay (HOSPITAL_COMMUNITY)
Admission: RE | Admit: 2016-08-03 | Payer: PRIVATE HEALTH INSURANCE | Source: Ambulatory Visit | Admitting: Obstetrics and Gynecology

## 2016-08-03 SURGERY — Surgical Case
Anesthesia: Spinal

## 2017-09-27 ENCOUNTER — Encounter: Payer: Self-pay | Admitting: Family Medicine

## 2017-09-27 ENCOUNTER — Ambulatory Visit: Payer: BC Managed Care – PPO | Admitting: Family Medicine

## 2017-09-27 ENCOUNTER — Other Ambulatory Visit: Payer: Self-pay

## 2017-09-27 VITALS — BP 132/86 | HR 83 | Temp 98.1°F | Resp 18 | Ht 63.0 in | Wt 196.2 lb

## 2017-09-27 DIAGNOSIS — E162 Hypoglycemia, unspecified: Secondary | ICD-10-CM | POA: Diagnosis not present

## 2017-09-27 DIAGNOSIS — Z13220 Encounter for screening for lipoid disorders: Secondary | ICD-10-CM | POA: Diagnosis not present

## 2017-09-27 DIAGNOSIS — R7303 Prediabetes: Secondary | ICD-10-CM

## 2017-09-27 DIAGNOSIS — Z6834 Body mass index (BMI) 34.0-34.9, adult: Secondary | ICD-10-CM

## 2017-09-27 DIAGNOSIS — R42 Dizziness and giddiness: Secondary | ICD-10-CM | POA: Diagnosis not present

## 2017-09-27 DIAGNOSIS — E6609 Other obesity due to excess calories: Secondary | ICD-10-CM

## 2017-09-27 DIAGNOSIS — R030 Elevated blood-pressure reading, without diagnosis of hypertension: Secondary | ICD-10-CM | POA: Diagnosis not present

## 2017-09-27 LAB — POCT GLYCOSYLATED HEMOGLOBIN (HGB A1C): Hemoglobin A1C: 6.2

## 2017-09-27 MED ORDER — METFORMIN HCL 500 MG PO TABS: 500.0000 mg | ORAL_TABLET | Freq: Every day | ORAL | 1 refills | Status: DC

## 2017-09-27 NOTE — Progress Notes (Signed)
Chief Complaint  Patient presents with  . Headache  . Gestational Diabetes    wants it to be montiored     HPI  Gestational Diabetes/Diabetes screening Pt reports that she had gestational diabetes in pregnancy She delivered 07/17/2016 She was on metformin during her pregnancy She reports that she gets a feeling of being faint like she has not eaten She reports that she has been trying to monitor her sugars due to her concern about developing diabetes She is here today to be checked for diabetes She has a family history of diabetes  Lightheadedness She reports that the sensation of lightheadedness lasted twice this week She denies tinnitus She denies nasal congestion No visual changes Her lightheadedness resolves after eating  Obesity It seems like after eating it has resolved Wt Readings from Last 3 Encounters:  09/27/17 196 lb 3.2 oz (89 kg)  07/16/16 231 lb (104.8 kg)  02/29/16 220 lb (99.8 kg)  Body mass index is 34.76 kg/m.  A month ago she weighed 212# She does not exercise She is a a Print production planner She drinks water during the day   Past Medical History:  Diagnosis Date  . Diabetes mellitus without complication ([place])    gestational  . Postpartum care following cesarean delivery Indication: Repeat, SROM (12/11) 07/17/2016    Current Outpatient Medications  Medication Sig Dispense Refill  . HYDROmorphone (DILAUDID) 4 MG tablet Take 1 tablet (4 mg total) by mouth every 4 (four) hours as needed for severe pain. (Patient not taking: Reported on 09/27/2017) 30 tablet 0  . ibuprofen (ADVIL,MOTRIN) 600 MG tablet Take 1 tablet (600 mg total) by mouth every 6 (six) hours. (Patient not taking: Reported on 09/27/2017) 30 tablet 0  . metFORMIN (GLUCOPHAGE) 500 MG tablet Take 500-1,000 mg by mouth 2 (two) times daily. Take 500mg  in the morning and 1000mg  in the evening    . Prenatal Vit-Fe Fumarate-FA (MULTIVITAMIN-PRENATAL) 27-0.8 MG TABS tablet Take 1 tablet by mouth  daily at 12 noon.    . senna-docusate (SENOKOT-S) 8.6-50 MG tablet Take 2 tablets by mouth daily. (Patient not taking: Reported on 09/27/2017)    . simethicone (MYLICON) 80 MG chewable tablet Chew 1 tablet (80 mg total) by mouth 3 (three) times daily after meals. (Patient not taking: Reported on 09/27/2017) 30 tablet 0   No current facility-administered medications for this visit.     Allergies:  Allergies  Allergen Reactions  . Codeine Anaphylaxis    Past Surgical History:  Procedure Laterality Date  . BREAST LUMPECTOMY    . BREAST SURGERY    . CESAREAN SECTION    . CESAREAN SECTION N/A 07/17/2016   Procedure: CESAREAN SECTION;  Surgeon: Servando Salina, MD;  Location: [facility];  Service: Obstetrics;  Laterality: N/A;    Social History   Socioeconomic History  . Marital status: Single    Spouse name: None  . Number of children: None  . Years of education: None  . Highest education level: None  Social Needs  . Financial resource strain: None  . Food insecurity - worry: None  . Food insecurity - inability: None  . Transportation needs - medical: None  . Transportation needs - non-medical: None  Occupational History  . Occupation: Print production planner  Tobacco Use  . Smoking status: Never Smoker  . Smokeless tobacco: Never Used  Substance and Sexual Activity  . Alcohol use: Yes    Comment: ocassional   . Drug use: No  . Sexual activity: Yes  Birth control/protection: Patch  Other Topics Concern  . None  Social History Narrative  . None    Family History  Problem Relation Age of Onset  . Diabetes Mother   . Hypertension Mother   . Diabetes Sister   . Diabetes Brother   . Heart disease Brother      ROS Review of Systems See HPI Constitution: No fevers or chills No malaise No diaphoresis Skin: No rash or itching Eyes: no blurry vision, no double vision GU: no dysuria or hematuria all others reviewed and negative   Objective: Vitals:    09/27/17 1510  BP: 132/86  Pulse: 83  Resp: 18  Temp: 98.1 F (36.7 C)  TempSrc: Oral  SpO2: 100%  Weight: 196 lb 3.2 oz (89 kg)  Height: 5\' 3"  (1.6 m)    Physical Exam  Constitutional: She is oriented to person, place, and time. She appears well-developed and well-nourished.  HENT:  Head: Normocephalic and atraumatic.  Eyes: EOM are normal. Pupils are equal, round, and reactive to light. Right eye exhibits normal extraocular motion and no nystagmus. Left eye exhibits normal extraocular motion and no nystagmus.  Neck: Normal range of motion. Neck supple. No thyroid mass and no thyromegaly present.  Cardiovascular: Normal rate, regular rhythm, normal heart sounds and intact distal pulses.  Pulmonary/Chest: Effort normal and breath sounds normal. No stridor. No respiratory distress. She has no wheezes. She has no rales. She exhibits no tenderness.  Neurological: She is alert and oriented to person, place, and time. She has normal strength. She is not disoriented. No cranial nerve deficit. Coordination and gait normal. GCS eye subscore is 4. GCS verbal subscore is 5. GCS motor subscore is 6.  Skin: Skin is warm. Capillary refill takes less than 2 seconds.  Psychiatric: She has a normal mood and affect. Her behavior is normal. Her mood appears not anxious. She is not agitated.    Assessment and Plan [name] was seen today for headache and gestational diabetes.  Diagnoses and all orders for this visit:  Hypoglycemia- advised pt to consume more proteins, will screen for diabetes -     POCT glycosylated hemoglobin (Hb A1C) -     Comprehensive metabolic panel -     TSH -     CBC  Lightheadedness- will check underlying causes -     Comprehensive metabolic panel -     TSH -     CBC  Prehypertension- discussed how to prevent hypertension DASH Diet and weight loss discussed  Class 1 obesity due to excess calories without serious comorbidity with body mass index (BMI) of 34.0 to 34.9  in adult- discussed diet and exercise  -     Lipid panel  Screening, lipid -     Lipid panel     Cassius Cullinane A Nolon Rod

## 2017-09-27 NOTE — Patient Instructions (Addendum)
   IF you received an x-ray today, you will receive an invoice from [facility]. Please contact [facility] at [phone] with questions or concerns regarding your invoice.   IF you received labwork today, you will receive an invoice from [facility]. Please contact [facility] at [phone] with questions or concerns regarding your invoice.   Our billing staff will not be able to assist you with questions regarding bills from these companies.  You will be contacted with the lab results as soon as they are available. The fastest way to get your results is to activate your My Chart account. Instructions are located on the last page of this paperwork. If you have not heard from us regarding the results in 2 weeks, please contact this office.    Prediabetes Eating Plan Prediabetes-also called impaired glucose tolerance or impaired fasting glucose-is a condition that causes blood sugar (blood glucose) levels to be higher than normal. Following a healthy diet can help to keep prediabetes under control. It can also help to lower the risk of type 2 diabetes and heart disease, which are increased in people who have prediabetes. Along with regular exercise, a healthy diet:  Promotes weight loss.  Helps to control blood sugar levels.  Helps to improve the way that the body uses insulin.  What do I need to know about this eating plan?  Use the glycemic index (GI) to plan your meals. The index tells you how quickly a food will raise your blood sugar. Choose low-GI foods. These foods take a longer time to raise blood sugar.  Pay close attention to the amount of carbohydrates in the food that you eat. Carbohydrates increase blood sugar levels.  Keep track of how many calories you take in. Eating the right amount of calories will help you to achieve a healthy weight. Losing about 7 percent of your starting weight can help to prevent type 2 diabetes.  You may want to follow a  Mediterranean diet. This diet includes a lot of vegetables, lean meats or fish, whole grains, fruits, and healthy oils and fats. What foods can I eat? Grains Whole grains, such as whole-wheat or whole-grain breads, crackers, cereals, and pasta. Unsweetened oatmeal. Bulgur. Barley. Quinoa. Brown rice. Corn or whole-wheat flour tortillas or taco shells. Vegetables Lettuce. Spinach. Peas. Beets. Cauliflower. Cabbage. Broccoli. Carrots. Tomatoes. Squash. Eggplant. Herbs. Peppers. Onions. Cucumbers. Brussels sprouts. Fruits Berries. Bananas. Apples. Oranges. Grapes. Papaya. Mango. Pomegranate. Kiwi. Grapefruit. Cherries. Meats and Other Protein Sources Seafood. Lean meats, such as chicken and turkey or lean cuts of pork and beef. Tofu. Eggs. Nuts. Beans. Dairy Low-fat or fat-free dairy products, such as yogurt, cottage cheese, and cheese. Beverages Water. Tea. Coffee. Sugar-free or diet soda. Seltzer water. Milk. Milk alternatives, such as soy or almond milk. Condiments Mustard. Relish. Low-fat, low-sugar ketchup. Low-fat, low-sugar barbecue sauce. Low-fat or fat-free mayonnaise. Sweets and Desserts Sugar-free or low-fat pudding. Sugar-free or low-fat ice cream and other frozen treats. Fats and Oils Avocado. Walnuts. Olive oil. The items listed above may not be a complete list of recommended foods or beverages. Contact your dietitian for more options. What foods are not recommended? Grains Refined white flour and flour products, such as bread, pasta, snack foods, and cereals. Beverages Sweetened drinks, such as sweet iced tea and soda. Sweets and Desserts Baked goods, such as cake, cupcakes, pastries, cookies, and cheesecake. The items listed above may not be a complete list of foods and beverages to avoid. Contact your dietitian for more information.   This information is not intended to replace advice given to you by your health care provider. Make sure you discuss any questions you have with  your health care provider. Document Released: 12/08/2014 Document Revised: 12/30/2015 Document Reviewed: 08/19/2014 Elsevier Interactive Patient Education  2017 Elsevier Inc.  

## 2017-09-28 LAB — CBC
Hematocrit: 35.6 % (ref 34.0–46.6)
Hemoglobin: 12.1 g/dL (ref 11.1–15.9)
MCH: 26.2 pg — ABNORMAL LOW (ref 26.6–33.0)
MCHC: 34 g/dL (ref 31.5–35.7)
MCV: 77 fL — ABNORMAL LOW (ref 79–97)
Platelets: 351 10*3/uL (ref 150–379)
RBC: 4.61 x10E6/uL (ref 3.77–5.28)
RDW: 14.3 % (ref 12.3–15.4)
WBC: 6 10*3/uL (ref 3.4–10.8)

## 2017-09-28 LAB — COMPREHENSIVE METABOLIC PANEL
ALT: 14 IU/L (ref 0–32)
AST: 16 IU/L (ref 0–40)
Albumin/Globulin Ratio: 1.5 (ref 1.2–2.2)
Albumin: 4.3 g/dL (ref 3.5–5.5)
Alkaline Phosphatase: 55 IU/L (ref 39–117)
BUN/Creatinine Ratio: 13 (ref 9–23)
BUN: 8 mg/dL (ref 6–20)
Bilirubin Total: 0.3 mg/dL (ref 0.0–1.2)
CO2: 19 mmol/L — ABNORMAL LOW (ref 20–29)
Calcium: 8.8 mg/dL (ref 8.7–10.2)
Chloride: 105 mmol/L (ref 96–106)
Creatinine, Ser: 0.6 mg/dL (ref 0.57–1.00)
GFR calc Af Amer: 133 mL/min/{1.73_m2} (ref >59)
GFR calc non Af Amer: 115 mL/min/{1.73_m2} (ref >59)
Globulin, Total: 2.8 g/dL (ref 1.5–4.5)
Glucose: 90 mg/dL (ref 65–99)
Potassium: 3.8 mmol/L (ref 3.5–5.2)
Sodium: 141 mmol/L (ref 134–144)
Total Protein: 7.1 g/dL (ref 6.0–8.5)

## 2017-09-28 LAB — TSH: TSH: 1.19 u[IU]/mL (ref 0.450–4.500)

## 2017-09-28 LAB — LIPID PANEL
Chol/HDL Ratio: 3.6 ratio (ref 0.0–4.4)
Cholesterol, Total: 186 mg/dL (ref 100–199)
HDL: 51 mg/dL (ref >39)
LDL Calculated: 125 mg/dL — ABNORMAL HIGH (ref 0–99)
Triglycerides: 52 mg/dL (ref 0–149)
VLDL Cholesterol Cal: 10 mg/dL (ref 5–40)

## 2017-10-01 ENCOUNTER — Telehealth: Payer: Self-pay | Admitting: Family Medicine

## 2017-10-01 NOTE — Telephone Encounter (Signed)
Copied from [place] [id number]. Topic: Inquiry >> Oct 01, 2017  4:30 PM Cecelia Byars, NT wrote: Reason for CRM: Pharmacy called in prescription with 2 sets of directions for metformin 500 mg please call the pharmacy at  [phone]

## 2017-10-04 NOTE — Telephone Encounter (Signed)
Please advise. dgaddy

## 2017-10-05 MED ORDER — METFORMIN HCL 500 MG PO TABS: 500.0000 mg | ORAL_TABLET | Freq: Every day | ORAL | 1 refills | Status: DC

## 2017-10-05 NOTE — Telephone Encounter (Signed)
Clarified directions to the pharmacy Lab Results  Component Value Date   HGBA1C 6.2 09/27/2017   Discussed once a day dosing.

## 2017-11-26 ENCOUNTER — Other Ambulatory Visit: Payer: Self-pay

## 2017-11-26 ENCOUNTER — Encounter: Payer: Self-pay | Admitting: Family Medicine

## 2017-11-26 ENCOUNTER — Ambulatory Visit: Payer: BC Managed Care – PPO | Admitting: Family Medicine

## 2017-11-26 VITALS — BP 122/88 | HR 81 | Temp 98.0°F | Resp 16 | Ht 63.0 in | Wt 196.0 lb

## 2017-11-26 DIAGNOSIS — J069 Acute upper respiratory infection, unspecified: Secondary | ICD-10-CM

## 2017-11-26 DIAGNOSIS — R300 Dysuria: Secondary | ICD-10-CM | POA: Diagnosis not present

## 2017-11-26 LAB — POCT URINALYSIS DIP (MANUAL ENTRY)
Bilirubin, UA: NEGATIVE
Glucose, UA: NEGATIVE mg/dL
Ketones, POC UA: NEGATIVE mg/dL
Nitrite, UA: NEGATIVE
Protein Ur, POC: NEGATIVE mg/dL
Spec Grav, UA: 1.02
Urobilinogen, UA: 1 U/dL
pH, UA: 6

## 2017-11-26 MED ORDER — AMOXICILLIN-POT CLAVULANATE 875-125 MG PO TABS: 1.0000 | ORAL_TABLET | Freq: Two times a day (BID) | ORAL | 0 refills | Status: DC

## 2017-11-26 MED ORDER — BENZONATATE 100 MG PO CAPS: 100.0000 mg | ORAL_CAPSULE | Freq: Three times a day (TID) | ORAL | 0 refills | Status: DC | PRN

## 2017-11-26 MED ORDER — LORATADINE-PSEUDOEPHEDRINE ER 5-120 MG PO TB12: 1.0000 | ORAL_TABLET | Freq: Two times a day (BID) | ORAL | 0 refills | Status: DC

## 2017-11-26 NOTE — Patient Instructions (Addendum)
     IF you received an x-ray today, you will receive an invoice from [facility]. Please contact [facility] at [phone] with questions or concerns regarding your invoice.   IF you received labwork today, you will receive an invoice from [facility]. Please contact [facility] at [phone] with questions or concerns regarding your invoice.   Our billing staff will not be able to assist you with questions regarding bills from these companies.  You will be contacted with the lab results as soon as they are available. The fastest way to get your results is to activate your My Chart account. Instructions are located on the last page of this paperwork. If you have not heard from us regarding the results in 2 weeks, please contact this office.     Urinary Tract Infection, Adult A urinary tract infection (UTI) is an infection of any part of the urinary tract. The urinary tract includes the:  Kidneys.  Ureters.  Bladder.  Urethra.  These organs make, store, and get rid of pee (urine) in the body. Follow these instructions at home:  Take over-the-counter and prescription medicines only as told by your doctor.  If you were prescribed an antibiotic medicine, take it as told by your doctor. Do not stop taking the antibiotic even if you start to feel better.  Avoid the following drinks: ? Alcohol. ? Caffeine. ? Tea. ? Carbonated drinks.  Drink enough fluid to keep your pee clear or pale yellow.  Keep all follow-up visits as told by your doctor. This is important.  Make sure to: ? Empty your bladder often and completely. Do not to hold pee for long periods of time. ? Empty your bladder before and after sex. ? Wipe from front to back after a bowel movement if you are female. Use each tissue one time when you wipe. Contact a doctor if:  You have back pain.  You have a fever.  You feel sick to your stomach (nauseous).  You throw up (vomit).  Your symptoms do  not get better after 3 days.  Your symptoms go away and then come back. Get help right away if:  You have very bad back pain.  You have very bad lower belly (abdominal) pain.  You are throwing up and cannot keep down any medicines or water. This information is not intended to replace advice given to you by your health care provider. Make sure you discuss any questions you have with your health care provider. Document Released: 01/10/2008 Document Revised: 12/30/2015 Document Reviewed: 06/14/2015 Elsevier Interactive Patient Education  2018 Elsevier Inc.  

## 2017-11-26 NOTE — Progress Notes (Signed)
Subjective:    Patient ID: [name], female    DOB: 08-09-1975, 42 y.o.   MRN: [id number]  11/26/2017  Dysuria (since Thursday ) and Cough (with some nasal since Friday )    HPI This 42 y.o. female presents for acute visit for dysuria and cough.  Urgency of urination; cloudy; end of urination there is tingle.  No fever; low grade fever only.  No n/v/d.  No abdominal pain; no back pain.  No hematuria.  Nocturia x 4; baseline x 1.  No vaginal dicharge; no vaginal itching or pan.  Sexually active; no contraception.  Considering pregnancy.  55 year old daughter, 6 old child.  No PNV.  LMP last week.  Cranberry pills.    Cough: son sick last week with viral infection.  Nasal congestion.  Sore throat four days ago.  No ear pain.  Congestion is green.  Body aches one night.  No chills/sweats.  Teacher. Theraflu.   BP Readings from Last 3 Encounters:  11/26/17 122/88  09/27/17 132/86  07/20/16 134/72   Wt Readings from Last 3 Encounters:  11/26/17 196 lb (88.9 kg)  09/27/17 196 lb 3.2 oz (89 kg)  07/16/16 231 lb (104.8 kg)   Immunization History  Administered Date(s) Administered  . Influenza Inj Mdck Quad Pf 06/05/2017    Review of Systems  Constitutional: Positive for fatigue and fever. Negative for chills and diaphoresis.  HENT: Positive for congestion, postnasal drip, rhinorrhea and sore throat. Negative for sinus pressure, sinus pain, sneezing, trouble swallowing and voice change.   Respiratory: Positive for cough.   Gastrointestinal: Negative for abdominal distention, abdominal pain, diarrhea, nausea and vomiting.  Genitourinary: Positive for dysuria, frequency and urgency. Negative for decreased urine volume, flank pain, genital sores, hematuria, menstrual problem, pelvic pain, vaginal bleeding, vaginal discharge and vaginal pain.  Skin: Negative for rash.  Neurological: Positive for headaches. Negative for dizziness and light-headedness.    Past Medical  History:  Diagnosis Date  . Diabetes mellitus without complication ([place])    gestational  . Postpartum care following cesarean delivery Indication: Repeat, SROM (12/11) 07/17/2016   Past Surgical History:  Procedure Laterality Date  . BREAST LUMPECTOMY    . BREAST SURGERY    . CESAREAN SECTION    . CESAREAN SECTION N/A 07/17/2016   Procedure: CESAREAN SECTION;  Surgeon: Servando Salina, MD;  Location: [facility];  Service: Obstetrics;  Laterality: N/A;   Allergies  Allergen Reactions  . Codeine Anaphylaxis   Current Outpatient Medications on File Prior to Visit  Medication Sig Dispense Refill  . metFORMIN (GLUCOPHAGE) 500 MG tablet Take 1 tablet (500 mg total) by mouth daily with breakfast. 90 tablet 1   No current facility-administered medications on file prior to visit.    Social History   Socioeconomic History  . Marital status: Single    Spouse name: Not on file  . Number of children: Not on file  . Years of education: Not on file  . Highest education level: Not on file  Occupational History  . Occupation: Print production planner  Social Needs  . Financial resource strain: Not on file  . Food insecurity:    Worry: Not on file    Inability: Not on file  . Transportation needs:    Medical: Not on file    Non-medical: Not on file  Tobacco Use  . Smoking status: Never Smoker  . Smokeless tobacco: Never Used  Substance and Sexual Activity  . Alcohol use:  Yes    Comment: ocassional   . Drug use: No  . Sexual activity: Yes    Birth control/protection: Patch  Lifestyle  . Physical activity:    Days per week: Not on file    Minutes per session: Not on file  . Stress: Not on file  Relationships  . Social connections:    Talks on phone: Not on file    Gets together: Not on file    Attends religious service: Not on file    Active member of club or organization: Not on file    Attends meetings of clubs or organizations: Not on file    Relationship status: Not  on file  . Intimate partner violence:    Fear of current or ex partner: Not on file    Emotionally abused: Not on file    Physically abused: Not on file    Forced sexual activity: Not on file  Other Topics Concern  . Not on file  Social History Narrative  . Not on file   Family History  Problem Relation Age of Onset  . Diabetes Mother   . Hypertension Mother   . Diabetes Sister   . Diabetes Brother   . Heart disease Brother        Objective:    BP 122/88   Pulse 81   Temp 98 F (36.7 C) (Other (Comment))   Resp 16   Ht 5\' 3"  (1.6 m)   Wt 196 lb (88.9 kg)   SpO2 100%   BMI 34.72 kg/m  Physical Exam  Constitutional: She is oriented to person, place, and time. She appears well-developed and well-nourished. No distress.  HENT:  Head: Normocephalic and atraumatic.  Right Ear: Tympanic membrane, external ear and ear canal normal.  Left Ear: Tympanic membrane, external ear and ear canal normal.  Nose: Nose normal.  Mouth/Throat: Oropharynx is clear and moist. No oropharyngeal exudate.  Eyes: Pupils are equal, round, and reactive to light. Conjunctivae are normal.  Neck: Normal range of motion. Neck supple.  Cardiovascular: Normal rate, regular rhythm and normal heart sounds. Exam reveals no gallop and no friction rub.  No murmur heard. Pulmonary/Chest: Effort normal and breath sounds normal. She has no wheezes. She has no rales.  Abdominal: Soft. Bowel sounds are normal. She exhibits no distension and no mass. There is no tenderness. There is no rebound, no guarding and no CVA tenderness. No hernia.  Lymphadenopathy:    She has no cervical adenopathy.  Neurological: She is alert and oriented to person, place, and time.  Skin: No rash noted. She is not diaphoretic.  Psychiatric: She has a normal mood and affect. Her behavior is normal.  Nursing note and vitals reviewed.  No results found. Depression screen [facility] 2/9 11/26/2017 09/27/2017  Decreased Interest 0 0  Down,  Depressed, Hopeless 0 0  PHQ - 2 Score 0 0   Fall Risk  11/26/2017 09/27/2017  Falls in the past year? No No        Assessment & Plan:   1. Dysuria   2. Acute upper respiratory infection     Dysuria: New onset.  Send urine culture.  Treat empirically with Augmentin therapy.  Viral upper respiratory infection: New onset.  Treat supportively with Tylenol, Afrin nasal spray,   RTC for acute worsening.   Orders Placed This Encounter  Procedures  . Urine Culture  . POCT urinalysis dipstick   Meds ordered this encounter  Medications  . amoxicillin-clavulanate (AUGMENTIN) 875-125  MG tablet    Sig: Take 1 tablet by mouth 2 (two) times daily.    Dispense:  14 tablet    Refill:  0  . benzonatate (TESSALON) 100 MG capsule    Sig: Take 1-2 capsules (100-200 mg total) by mouth 3 (three) times daily as needed for cough.    Dispense:  40 capsule    Refill:  0  . loratadine-pseudoephedrine (CLARITIN-D 12-HOUR) 5-120 MG tablet    Sig: Take 1 tablet by mouth 2 (two) times daily.    Dispense:  20 tablet    Refill:  0    No follow-ups on file.   Hartlyn Reigel Elayne Guerin, M.D. Primary Care at [facility] [address] [place], [place]  [address] [phone] phone [phone] fax

## 2017-11-28 LAB — URINE CULTURE

## 2017-12-26 ENCOUNTER — Ambulatory Visit: Payer: BC Managed Care – PPO | Admitting: Family Medicine

## 2017-12-27 ENCOUNTER — Encounter: Payer: Self-pay | Admitting: Family Medicine

## 2018-01-07 ENCOUNTER — Encounter (HOSPITAL_COMMUNITY): Payer: Self-pay

## 2018-01-16 LAB — OB RESULTS CONSOLE ABO/RH: RH Type: POSITIVE

## 2018-01-16 LAB — OB RESULTS CONSOLE RUBELLA ANTIBODY, IGM: Rubella: IMMUNE

## 2018-01-16 LAB — OB RESULTS CONSOLE HIV ANTIBODY (ROUTINE TESTING): HIV: NONREACTIVE

## 2018-01-16 LAB — OB RESULTS CONSOLE GC/CHLAMYDIA
Chlamydia: NEGATIVE
Gonorrhea: NEGATIVE

## 2018-01-16 LAB — OB RESULTS CONSOLE RPR: RPR: NONREACTIVE

## 2018-01-16 LAB — OB RESULTS CONSOLE HEPATITIS B SURFACE ANTIGEN: Hepatitis B Surface Ag: NEGATIVE

## 2018-01-16 LAB — OB RESULTS CONSOLE ANTIBODY SCREEN: Antibody Screen: NEGATIVE

## 2018-02-01 LAB — OB RESULTS CONSOLE RPR: RPR: NONREACTIVE

## 2018-02-01 LAB — OB RESULTS CONSOLE GC/CHLAMYDIA
Chlamydia: NEGATIVE
Gonorrhea: NEGATIVE

## 2018-02-01 LAB — OB RESULTS CONSOLE HIV ANTIBODY (ROUTINE TESTING): HIV: NONREACTIVE

## 2018-02-01 LAB — OB RESULTS CONSOLE HEPATITIS B SURFACE ANTIGEN: Hepatitis B Surface Ag: NEGATIVE

## 2018-02-01 LAB — OB RESULTS CONSOLE RUBELLA ANTIBODY, IGM: Rubella: IMMUNE

## 2018-02-03 ENCOUNTER — Other Ambulatory Visit: Payer: Self-pay | Admitting: Family Medicine

## 2018-02-04 NOTE — Telephone Encounter (Signed)
Refill request for metformin 500 mg #30 with 0 refills.  Pt needs ov.  Will send to schedulers to call pt and schedule hypoglycemia f/u with stallings. Dgaddy, CMA

## 2018-03-26 ENCOUNTER — Other Ambulatory Visit (HOSPITAL_COMMUNITY): Payer: Self-pay | Admitting: Obstetrics and Gynecology

## 2018-03-26 DIAGNOSIS — Z361 Encounter for antenatal screening for raised alphafetoprotein level: Secondary | ICD-10-CM

## 2018-03-26 DIAGNOSIS — Z3689 Encounter for other specified antenatal screening: Secondary | ICD-10-CM

## 2018-03-26 DIAGNOSIS — Z3A22 22 weeks gestation of pregnancy: Secondary | ICD-10-CM

## 2018-03-29 ENCOUNTER — Encounter (HOSPITAL_COMMUNITY): Payer: Self-pay | Admitting: *Deleted

## 2018-04-02 ENCOUNTER — Ambulatory Visit (HOSPITAL_COMMUNITY)
Admission: RE | Admit: 2018-04-02 | Discharge: 2018-04-02 | Disposition: A | Discharge status: Home / Self Care | Payer: BC Managed Care – PPO | Source: Ambulatory Visit | Attending: Obstetrics and Gynecology | Admitting: Obstetrics and Gynecology

## 2018-04-02 ENCOUNTER — Other Ambulatory Visit (HOSPITAL_COMMUNITY): Payer: Self-pay | Admitting: Obstetrics and Gynecology

## 2018-04-02 ENCOUNTER — Encounter (HOSPITAL_COMMUNITY): Payer: Self-pay

## 2018-04-02 ENCOUNTER — Other Ambulatory Visit (HOSPITAL_COMMUNITY): Payer: Self-pay | Admitting: *Deleted

## 2018-04-02 DIAGNOSIS — O24410 Gestational diabetes mellitus in pregnancy, diet controlled: Secondary | ICD-10-CM | POA: Diagnosis not present

## 2018-04-02 DIAGNOSIS — Z3A19 19 weeks gestation of pregnancy: Secondary | ICD-10-CM | POA: Insufficient documentation

## 2018-04-02 DIAGNOSIS — O09522 Supervision of elderly multigravida, second trimester: Secondary | ICD-10-CM

## 2018-04-02 DIAGNOSIS — O34219 Maternal care for unspecified type scar from previous cesarean delivery: Secondary | ICD-10-CM

## 2018-04-02 DIAGNOSIS — O281 Abnormal biochemical finding on antenatal screening of mother: Secondary | ICD-10-CM

## 2018-04-02 DIAGNOSIS — Z362 Encounter for other antenatal screening follow-up: Secondary | ICD-10-CM

## 2018-04-02 DIAGNOSIS — Z363 Encounter for antenatal screening for malformations: Secondary | ICD-10-CM

## 2018-04-02 DIAGNOSIS — Z3689 Encounter for other specified antenatal screening: Secondary | ICD-10-CM

## 2018-04-02 DIAGNOSIS — Z361 Encounter for antenatal screening for raised alphafetoprotein level: Secondary | ICD-10-CM

## 2018-04-02 DIAGNOSIS — Z3A22 22 weeks gestation of pregnancy: Secondary | ICD-10-CM

## 2018-04-02 HISTORY — DX: Gestational diabetes mellitus in pregnancy, unspecified control: O24.419

## 2018-04-10 ENCOUNTER — Encounter: Payer: BC Managed Care – PPO | Attending: Obstetrics and Gynecology | Admitting: Registered"

## 2018-04-10 DIAGNOSIS — O09529 Supervision of elderly multigravida, unspecified trimester: Secondary | ICD-10-CM | POA: Insufficient documentation

## 2018-04-10 DIAGNOSIS — Z3A00 Weeks of gestation of pregnancy not specified: Secondary | ICD-10-CM | POA: Diagnosis not present

## 2018-04-10 DIAGNOSIS — Z713 Dietary counseling and surveillance: Secondary | ICD-10-CM | POA: Insufficient documentation

## 2018-04-10 DIAGNOSIS — O99810 Abnormal glucose complicating pregnancy: Secondary | ICD-10-CM | POA: Diagnosis not present

## 2018-04-16 ENCOUNTER — Encounter: Payer: Self-pay | Admitting: Registered"

## 2018-04-16 NOTE — Progress Notes (Signed)

## 2018-04-30 ENCOUNTER — Ambulatory Visit (HOSPITAL_COMMUNITY)
Admission: RE | Admit: 2018-04-30 | Discharge: 2018-04-30 | Disposition: A | Discharge status: Home / Self Care | Payer: BC Managed Care – PPO | Source: Ambulatory Visit | Attending: Obstetrics and Gynecology | Admitting: Obstetrics and Gynecology

## 2018-05-15 ENCOUNTER — Observation Stay (HOSPITAL_COMMUNITY)
Admission: RE | Admit: 2018-05-15 | Discharge: 2018-05-15 | Disposition: A | Discharge status: Home / Self Care | Payer: BC Managed Care – PPO | Source: Ambulatory Visit | Attending: Obstetrics and Gynecology | Admitting: Obstetrics and Gynecology

## 2018-05-15 ENCOUNTER — Encounter (HOSPITAL_COMMUNITY): Payer: Self-pay

## 2018-05-15 DIAGNOSIS — Z3A25 25 weeks gestation of pregnancy: Secondary | ICD-10-CM

## 2018-05-15 DIAGNOSIS — Z3A27 27 weeks gestation of pregnancy: Secondary | ICD-10-CM | POA: Diagnosis not present

## 2018-05-15 DIAGNOSIS — O321XX0 Maternal care for breech presentation, not applicable or unspecified: Secondary | ICD-10-CM | POA: Insufficient documentation

## 2018-05-15 DIAGNOSIS — O24414 Gestational diabetes mellitus in pregnancy, insulin controlled: Secondary | ICD-10-CM | POA: Diagnosis not present

## 2018-05-15 DIAGNOSIS — Z362 Encounter for other antenatal screening follow-up: Secondary | ICD-10-CM

## 2018-05-15 DIAGNOSIS — O09212 Supervision of pregnancy with history of pre-term labor, second trimester: Secondary | ICD-10-CM

## 2018-05-15 DIAGNOSIS — O09522 Supervision of elderly multigravida, second trimester: Secondary | ICD-10-CM | POA: Diagnosis not present

## 2018-05-15 DIAGNOSIS — O34219 Maternal care for unspecified type scar from previous cesarean delivery: Secondary | ICD-10-CM

## 2018-05-15 DIAGNOSIS — Z363 Encounter for antenatal screening for malformations: Secondary | ICD-10-CM | POA: Diagnosis not present

## 2018-05-16 ENCOUNTER — Other Ambulatory Visit (HOSPITAL_COMMUNITY): Payer: Self-pay | Admitting: *Deleted

## 2018-05-16 DIAGNOSIS — O24414 Gestational diabetes mellitus in pregnancy, insulin controlled: Secondary | ICD-10-CM

## 2018-06-05 ENCOUNTER — Other Ambulatory Visit (HOSPITAL_COMMUNITY): Payer: Self-pay | Admitting: *Deleted

## 2018-06-05 DIAGNOSIS — O24414 Gestational diabetes mellitus in pregnancy, insulin controlled: Secondary | ICD-10-CM

## 2018-06-12 ENCOUNTER — Encounter (HOSPITAL_COMMUNITY): Payer: Self-pay

## 2018-06-12 ENCOUNTER — Ambulatory Visit (HOSPITAL_COMMUNITY)
Admission: RE | Admit: 2018-06-12 | Discharge: 2018-06-12 | Disposition: A | Discharge status: Home / Self Care | Payer: BC Managed Care – PPO | Source: Ambulatory Visit | Attending: Obstetrics and Gynecology | Admitting: Obstetrics and Gynecology

## 2018-06-12 DIAGNOSIS — O24414 Gestational diabetes mellitus in pregnancy, insulin controlled: Secondary | ICD-10-CM | POA: Diagnosis not present

## 2018-06-12 DIAGNOSIS — O289 Unspecified abnormal findings on antenatal screening of mother: Secondary | ICD-10-CM | POA: Diagnosis not present

## 2018-06-12 DIAGNOSIS — O09213 Supervision of pregnancy with history of pre-term labor, third trimester: Secondary | ICD-10-CM | POA: Insufficient documentation

## 2018-06-12 DIAGNOSIS — O34219 Maternal care for unspecified type scar from previous cesarean delivery: Secondary | ICD-10-CM | POA: Insufficient documentation

## 2018-06-12 DIAGNOSIS — O09523 Supervision of elderly multigravida, third trimester: Secondary | ICD-10-CM | POA: Diagnosis not present

## 2018-06-12 DIAGNOSIS — Z3A29 29 weeks gestation of pregnancy: Secondary | ICD-10-CM | POA: Insufficient documentation

## 2018-06-13 ENCOUNTER — Other Ambulatory Visit (HOSPITAL_COMMUNITY): Payer: Self-pay | Admitting: *Deleted

## 2018-06-13 DIAGNOSIS — O24313 Unspecified pre-existing diabetes mellitus in pregnancy, third trimester: Principal | ICD-10-CM

## 2018-06-13 DIAGNOSIS — IMO0001 Reserved for inherently not codable concepts without codable children: Secondary | ICD-10-CM

## 2018-06-13 DIAGNOSIS — Z794 Long term (current) use of insulin: Principal | ICD-10-CM

## 2018-07-03 ENCOUNTER — Ambulatory Visit (HOSPITAL_COMMUNITY)
Admission: RE | Admit: 2018-07-03 | Discharge: 2018-07-03 | Disposition: A | Discharge status: Home / Self Care | Payer: BC Managed Care – PPO | Source: Ambulatory Visit | Attending: Obstetrics and Gynecology | Admitting: Obstetrics and Gynecology

## 2018-07-03 ENCOUNTER — Encounter (HOSPITAL_COMMUNITY): Payer: Self-pay

## 2018-07-03 DIAGNOSIS — O24414 Gestational diabetes mellitus in pregnancy, insulin controlled: Secondary | ICD-10-CM | POA: Insufficient documentation

## 2018-07-03 DIAGNOSIS — O09213 Supervision of pregnancy with history of pre-term labor, third trimester: Secondary | ICD-10-CM

## 2018-07-03 DIAGNOSIS — Z3A32 32 weeks gestation of pregnancy: Secondary | ICD-10-CM | POA: Diagnosis not present

## 2018-07-03 DIAGNOSIS — O09523 Supervision of elderly multigravida, third trimester: Secondary | ICD-10-CM | POA: Diagnosis not present

## 2018-07-03 DIAGNOSIS — O34219 Maternal care for unspecified type scar from previous cesarean delivery: Secondary | ICD-10-CM

## 2018-07-03 DIAGNOSIS — O289 Unspecified abnormal findings on antenatal screening of mother: Secondary | ICD-10-CM | POA: Diagnosis not present

## 2018-07-10 ENCOUNTER — Inpatient Hospital Stay (HOSPITAL_COMMUNITY)
Admission: AD | Admit: 2018-07-10 | Discharge: 2018-07-12 | DRG: 833 | Disposition: A | Discharge status: Home / Self Care | Payer: BC Managed Care – PPO | Attending: Obstetrics and Gynecology | Admitting: Obstetrics and Gynecology

## 2018-07-10 ENCOUNTER — Ambulatory Visit (HOSPITAL_BASED_OUTPATIENT_CLINIC_OR_DEPARTMENT_OTHER)
Admission: RE | Admit: 2018-07-10 | Discharge: 2018-07-10 | Disposition: A | Discharge status: Home / Self Care | Payer: BC Managed Care – PPO | Source: Ambulatory Visit | Attending: Family Medicine | Admitting: Family Medicine

## 2018-07-10 ENCOUNTER — Encounter (HOSPITAL_COMMUNITY): Payer: Self-pay

## 2018-07-10 ENCOUNTER — Ambulatory Visit (HOSPITAL_COMMUNITY): Payer: BC Managed Care – PPO

## 2018-07-10 DIAGNOSIS — Z3A33 33 weeks gestation of pregnancy: Secondary | ICD-10-CM | POA: Diagnosis not present

## 2018-07-10 DIAGNOSIS — O09523 Supervision of elderly multigravida, third trimester: Secondary | ICD-10-CM

## 2018-07-10 DIAGNOSIS — O34219 Maternal care for unspecified type scar from previous cesarean delivery: Secondary | ICD-10-CM | POA: Insufficient documentation

## 2018-07-10 DIAGNOSIS — O24313 Unspecified pre-existing diabetes mellitus in pregnancy, third trimester: Principal | ICD-10-CM

## 2018-07-10 DIAGNOSIS — O24414 Gestational diabetes mellitus in pregnancy, insulin controlled: Secondary | ICD-10-CM

## 2018-07-10 DIAGNOSIS — O289 Unspecified abnormal findings on antenatal screening of mother: Secondary | ICD-10-CM

## 2018-07-10 DIAGNOSIS — O133 Gestational [pregnancy-induced] hypertension without significant proteinuria, third trimester: Principal | ICD-10-CM | POA: Diagnosis present

## 2018-07-10 DIAGNOSIS — O09213 Supervision of pregnancy with history of pre-term labor, third trimester: Secondary | ICD-10-CM

## 2018-07-10 DIAGNOSIS — R030 Elevated blood-pressure reading, without diagnosis of hypertension: Secondary | ICD-10-CM | POA: Diagnosis present

## 2018-07-10 DIAGNOSIS — Z794 Long term (current) use of insulin: Principal | ICD-10-CM

## 2018-07-10 DIAGNOSIS — IMO0001 Reserved for inherently not codable concepts without codable children: Secondary | ICD-10-CM

## 2018-07-10 LAB — COMPREHENSIVE METABOLIC PANEL
ALT: 15 U/L (ref 0–44)
AST: 22 U/L (ref 15–41)
Albumin: 2.8 g/dL — ABNORMAL LOW (ref 3.5–5.0)
Alkaline Phosphatase: 66 U/L (ref 38–126)
Anion gap: 8 (ref 5–15)
BUN: 5 mg/dL — ABNORMAL LOW (ref 6–20)
CO2: 19 mmol/L — ABNORMAL LOW (ref 22–32)
Calcium: 8.5 mg/dL — ABNORMAL LOW (ref 8.9–10.3)
Chloride: 110 mmol/L (ref 98–111)
Creatinine, Ser: 0.41 mg/dL — ABNORMAL LOW (ref 0.44–1.00)
GFR calc Af Amer: >60 mL/min (ref >60)
GFR calc non Af Amer: >60 mL/min (ref >60)
Glucose, Bld: 88 mg/dL (ref 70–99)
Potassium: 3.7 mmol/L (ref 3.5–5.1)
Sodium: 137 mmol/L (ref 135–145)
Total Bilirubin: 0.3 mg/dL (ref 0.3–1.2)
Total Protein: 6.1 g/dL — ABNORMAL LOW (ref 6.5–8.1)

## 2018-07-10 LAB — GLUCOSE, CAPILLARY
Glucose-Capillary: 78 mg/dL (ref 70–99)
Glucose-Capillary: 113 mg/dL — ABNORMAL HIGH (ref 70–99)

## 2018-07-10 LAB — CBC
HCT: 31.8 % — ABNORMAL LOW (ref 36.0–46.0)
Hemoglobin: 10.4 g/dL — ABNORMAL LOW (ref 12.0–15.0)
MCH: 25.2 pg — ABNORMAL LOW (ref 26.0–34.0)
MCHC: 32.7 g/dL (ref 30.0–36.0)
MCV: 77.2 fL — ABNORMAL LOW (ref 80.0–100.0)
Platelets: 225 10*3/uL (ref 150–400)
RBC: 4.12 MIL/uL (ref 3.87–5.11)
RDW: 14.9 % (ref 11.5–15.5)
WBC: 7.1 10*3/uL (ref 4.0–10.5)
nRBC: 0 % (ref 0.0–0.2)

## 2018-07-10 LAB — OB RESULTS CONSOLE GBS: GBS: POSITIVE

## 2018-07-10 LAB — TYPE AND SCREEN
ABO/RH(D): A POS
Antibody Screen: NEGATIVE

## 2018-07-10 LAB — PROTEIN / CREATININE RATIO, URINE
Creatinine, Urine: 64 mg/dL
Protein Creatinine Ratio: 0.2 mg/mg{Cre} — ABNORMAL HIGH (ref 0.00–0.15)
Total Protein, Urine: 13 mg/dL

## 2018-07-10 LAB — URIC ACID: Uric Acid, Serum: 4.2 mg/dL (ref 2.5–7.1)

## 2018-07-10 MED ORDER — LACTATED RINGERS IV SOLN
INTRAVENOUS | Status: DC
  Administered 2018-07-10 – 2018-07-11 (×2): via INTRAVENOUS

## 2018-07-10 MED ORDER — INSULIN ASPART 100 UNIT/ML ~~LOC~~ SOLN
4.0000 [IU] | Freq: Two times a day (BID) | SUBCUTANEOUS | Status: DC
  Administered 2018-07-11 (×2): 4 [IU] via SUBCUTANEOUS

## 2018-07-10 MED ORDER — HYDRALAZINE HCL 20 MG/ML IJ SOLN: 10.0000 mg | INTRAMUSCULAR | Status: DC | PRN

## 2018-07-10 MED ORDER — INSULIN GLARGINE 100 UNIT/ML ~~LOC~~ SOLN
24.0000 [IU] | Freq: Every day | SUBCUTANEOUS | Status: DC
  Administered 2018-07-10 – 2018-07-11 (×2): 24 [IU] via SUBCUTANEOUS
  Filled 2018-07-10 (×2): qty 0.24

## 2018-07-10 MED ORDER — ACETAMINOPHEN 325 MG PO TABS: 650.0000 mg | ORAL_TABLET | ORAL | Status: DC | PRN

## 2018-07-10 MED ORDER — INSULIN ASPART 100 UNIT/ML ~~LOC~~ SOLN
6.0000 [IU] | Freq: Every day | SUBCUTANEOUS | Status: DC
  Administered 2018-07-10 – 2018-07-11 (×2): 6 [IU] via SUBCUTANEOUS

## 2018-07-10 MED ORDER — CALCIUM CARBONATE ANTACID 500 MG PO CHEW: 2.0000 | CHEWABLE_TABLET | ORAL | Status: DC | PRN

## 2018-07-10 MED ORDER — PRENATAL MULTIVITAMIN CH
1.0000 | ORAL_TABLET | Freq: Every day | ORAL | Status: DC
  Administered 2018-07-11: 1 via ORAL
  Filled 2018-07-10: qty 1

## 2018-07-10 MED ORDER — DOCUSATE SODIUM 100 MG PO CAPS
100.0000 mg | ORAL_CAPSULE | Freq: Every day | ORAL | Status: DC
  Administered 2018-07-11: 100 mg via ORAL
  Filled 2018-07-10: qty 1

## 2018-07-10 MED ORDER — INSULIN DETEMIR 100 UNIT/ML ~~LOC~~ SOLN: 24.0000 [IU] | Freq: Every day | SUBCUTANEOUS | Status: DC

## 2018-07-10 MED ORDER — LABETALOL HCL 5 MG/ML IV SOLN: 40.0000 mg | INTRAVENOUS | Status: DC | PRN

## 2018-07-10 MED ORDER — MAGNESIUM SULFATE 40 G IN LACTATED RINGERS - SIMPLE
2.0000 g/h | INTRAVENOUS | Status: DC
  Filled 2018-07-10: qty 500

## 2018-07-10 MED ORDER — HYDRALAZINE HCL 50 MG PO TABS
50.0000 mg | ORAL_TABLET | Freq: Three times a day (TID) | ORAL | Status: DC
  Administered 2018-07-10: 50 mg via ORAL
  Filled 2018-07-10 (×9): qty 1

## 2018-07-10 MED ORDER — ZOLPIDEM TARTRATE 5 MG PO TABS: 5.0000 mg | ORAL_TABLET | Freq: Every evening | ORAL | Status: DC | PRN

## 2018-07-10 MED ORDER — LABETALOL HCL 5 MG/ML IV SOLN: 20.0000 mg | INTRAVENOUS | Status: DC | PRN

## 2018-07-10 MED ORDER — HYDRALAZINE HCL 20 MG/ML IJ SOLN
5.0000 mg | INTRAMUSCULAR | Status: DC | PRN
  Administered 2018-07-10: 5 mg via INTRAVENOUS
  Filled 2018-07-10: qty 1

## 2018-07-10 MED ORDER — MAGNESIUM SULFATE BOLUS VIA INFUSION
4.0000 g | Freq: Once | INTRAVENOUS | Status: AC
  Administered 2018-07-10: 4 g via INTRAVENOUS
  Filled 2018-07-10: qty 500

## 2018-07-10 NOTE — MAU Note (Signed)
Seen today in MFM due to typeII diabetes, b/p elevated

## 2018-07-10 NOTE — ED Notes (Signed)
Report called to L. Tommi Rumps, RN in MAU of patient's increase in BP today, at the office and here in [facility]. Patient is asymptomatic. Dr. Yates Decamp advises further eval and is currently discussing with patient. Has paged Dr. Jackqulyn Livings 2.

## 2018-07-11 ENCOUNTER — Other Ambulatory Visit: Payer: Self-pay

## 2018-07-11 LAB — RPR: RPR Ser Ql: NONREACTIVE

## 2018-07-11 LAB — GLUCOSE, CAPILLARY
Glucose-Capillary: 103 mg/dL — ABNORMAL HIGH (ref 70–99)
Glucose-Capillary: 81 mg/dL (ref 70–99)
Glucose-Capillary: 126 mg/dL — ABNORMAL HIGH (ref 70–99)
Glucose-Capillary: 105 mg/dL — ABNORMAL HIGH (ref 70–99)

## 2018-07-11 MED ORDER — NIFEDIPINE ER OSMOTIC RELEASE 30 MG PO TB24
30.0000 mg | ORAL_TABLET | Freq: Every day | ORAL | Status: DC
  Administered 2018-07-11: 30 mg via ORAL
  Filled 2018-07-11 (×2): qty 1

## 2018-07-11 NOTE — H&P (Signed)
[name] is a 42 y.o. female presenting @ 33 5/[redacted] weeks gestation  for admission due to elevated BP noted at office and at [place] visit. Pt has hx preeclampsia with prior pregnancy. Pt denies h/a, visual disturbance, heartburn or leg swelling. PNC complicated by Class Z6/X GDM on insulin.  Unexplained elevated AFP., AMA. Hx PTB on Makena  OB History    Gravida  6   Para  2   Term  1   Preterm  1   AB  3   Living  2     SAB  3   TAB      Ectopic      Multiple  0   Live Births  1          Past Medical History:  Diagnosis Date  . Diabetes mellitus without complication (HCC)    gestational  . Gestational diabetes   . Postpartum care following cesarean delivery Indication: Repeat, SROM (12/11) 07/17/2016   Past Surgical History:  Procedure Laterality Date  . BREAST LUMPECTOMY    . BREAST SURGERY    . CESAREAN SECTION    . CESAREAN SECTION N/A 07/17/2016   Procedure: CESAREAN SECTION;  Surgeon: Servando Salina, MD;  Location: [facility];  Service: Obstetrics;  Laterality: N/A;   Family History: family history includes Diabetes in her brother, mother, and sister; Heart disease in her brother; Hypertension in her mother. Social History:  reports that she has never smoked. She has never used smokeless tobacco. She reports that she drank alcohol. She reports that she does not use drugs.     Maternal Diabetes: Yes:  Diabetes Type:  Insulin/Medication controlled Genetic Screening: Abnormal:  Results: Elevated AFP Maternal Ultrasounds/Referrals: Normal Fetal Ultrasounds or other Referrals:  Fetal echo nl Maternal Substance Abuse:  No Significant Maternal Medications:  Meds include: Other: insulin Significant Maternal Lab Results:  None Other Comments:  previous C/S x 2, hx PTB, unexplained elev AFP  Review of Systems  Eyes: Negative for blurred vision.  Gastrointestinal: Negative for heartburn.  Neurological: Negative for headaches.  All other systems  reviewed and are negative.  History   Blood pressure 122/83, pulse 89, temperature 98 F (36.7 C), temperature source Axillary, resp. rate 18, height 5\' 3"  (1.6 m), weight 102.5 kg, last menstrual period 11/16/2017, SpO2 100 %, unknown if currently breastfeeding. Exam Physical Exam  Constitutional: She appears well-developed and well-nourished.  HENT:  Head: Atraumatic.  Eyes: EOM are normal.  Neck: Neck supple.  Cardiovascular: Regular rhythm.  Respiratory: Breath sounds normal.  GI: Soft.  Musculoskeletal: She exhibits no edema.  Skin: Skin is warm and dry.  Psychiatric: She has a normal mood and affect.   VE deferred  Prenatal labs: ABO, Rh: --/--/A POS (12/04 1901) Antibody: NEG (12/04 1901) Rubella:  Immune RPR: Non Reactive (12/04 1901)  HBsAg:   neg HIV:   neg GBS:   not done CBC    Component Value Date/Time   WBC 7.1 07/10/2018 1825   RBC 4.12 07/10/2018 1825   HGB 10.4 (L) 07/10/2018 1825   HGB 12.1 09/27/2017 1552   HCT 31.8 (L) 07/10/2018 1825   HCT 35.6 09/27/2017 1552   PLT 225 07/10/2018 1825   PLT 351 09/27/2017 1552   MCV 77.2 (L) 07/10/2018 1825   MCV 77 (L) 09/27/2017 1552   MCH 25.2 (L) 07/10/2018 1825   MCHC 32.7 07/10/2018 1825   RDW 14.9 07/10/2018 1825   RDW 14.3 09/27/2017 1552  CMP Latest Ref Rng & Units 07/10/2018 09/27/2017 07/19/2016  Glucose 70 - 99 mg/dL 88 90 78  BUN 6 - 20 mg/dL 5(L) 8 11  Creatinine 0.44 - 1.00 mg/dL 0.41(L) 0.60 0.64  Sodium 135 - 145 mmol/L 137 141 132(L)  Potassium 3.5 - 5.1 mmol/L 3.7 3.8 3.8  Chloride 98 - 111 mmol/L 110 105 104  CO2 22 - 32 mmol/L 19(L) 19(L) 21(L)  Calcium 8.9 - 10.3 mg/dL 8.5(L) 8.8 8.4(L)  Total Protein 6.5 - 8.1 g/dL 6.1(L) 7.1 5.8(L)  Total Bilirubin 0.3 - 1.2 mg/dL 0.3 0.3 0.5  Alkaline Phos 38 - 126 U/L 66 55 86  AST 15 - 41 U/L 22 16 30   ALT 0 - 44 U/L 15 14 29   urine protein/creatinine 0.2 [place] Mfm Fetal Bpp Wo Non Stress  Result Date:  07/10/2018 ----------------------------------------------------------------------  OBSTETRICS REPORT                       (Signed Final 07/10/2018 06:18 pm) ---------------------------------------------------------------------- Patient Info  ID #:       [id number]                          D.O.B.:  11/01/1975 (42 yrs)  Name:       [name]                  Visit Date: 07/10/2018 04:58 pm ---------------------------------------------------------------------- Performed By  Performed By:     Novella Rob        Ref. Address:     [place], [place]  [place]  Attending:        Lajoyce Lauber      Location:         [facility]                    MD  Referred By:      Alanda Slim                    Kamarian Sahakian MD ---------------------------------------------------------------------- Orders   #  Description                          Code         Ordered By   1  [place] MFM OB FOLLOW UP                  [phone]     Lora Paula   2  [place] MFM FETAL BPP WO NON              76819.01     [place]  ----------------------------------------------------------------------   #  Order #                    Accession #                 Episode #   1  [id number]                  [id number]                  [id number]   2  [id number]                  [id number]                  [id number]  ---------------------------------------------------------------------- Indications   [redacted] weeks gestation of pregnancy                Z3A.41   Advanced maternal age multigravida 75+,        O40.523   third trimester   Abnormal biochemical screen (elevated AFP)     O28.9    History of cesarean delivery, currently        O8.219   pregnant x 2   Gestational diabetes in pregnancy, insulin     O24.414   controlled   Poor obstetric history: Previous preterm       O09.219   delivery, antepartum (@[redacted]w[redacted]d )  ---------------------------------------------------------------------- Vital Signs  Weight (lb): 227                               Height:        5'4"  BMI:         38.96 ---------------------------------------------------------------------- Fetal Evaluation  Num Of Fetuses:         1  Fetal Heart Rate(bpm):  159  Cardiac Activity:       Observed  Presentation:           Cephalic  Placenta:               Posterior  P. Cord Insertion:      Previously Visualized  Amniotic Fluid  AFI FV:      Within normal limits  AFI Sum(cm)     %Tile       Largest Pocket(cm)  12.27  35          4.65  RUQ(cm)       RLQ(cm)       LUQ(cm)        LLQ(cm)  4.65          0             3.7            3.92 ---------------------------------------------------------------------- Biophysical Evaluation  Amniotic F.V:   Within normal limits       F. Tone:        Observed  F. Movement:    Observed                   Score:          8/8  F. Breathing:   Observed ---------------------------------------------------------------------- Biometry  BPD:        86  mm     G. Age:  34w 5d         74  %    CI:         75.4   %    70 - 86                                                          FL/HC:      20.2   %    19.4 - 21.8  HC:      314.1  mm     G. Age:  35w 1d         52  %    HC/AC:      0.91        0.96 - 1.11  AC:      346.7  mm     G. Age:  38w 4d       > 97  %    FL/BPD:     74.0   %    71 - 87  FL:       63.6  mm     G. Age:  32w 6d         20  %    FL/AC:      18.3   %    20 - 24  HUM:        59  mm     G. Age:  34w 1d         68  %  Est. FW:    2938  gm      6 lb 8 oz   > 90  % ---------------------------------------------------------------------- OB History  Gravidity:    6         Term:   1        Prem:   1         SAB:   3  TOP:          0       Ectopic:  0        Living: 2 ---------------------------------------------------------------------- Gestational Age  LMP:           33w 5d        Date:  11/16/17                 EDD:   08/23/18  U/S Today:  35w 2d                                        EDD:   08/12/18  Best:          33w 5d     Det. By:  LMP  (11/16/17)          EDD:   08/23/18 ---------------------------------------------------------------------- Anatomy  Cranium:               Appears normal         Aortic Arch:            Previously seen  Cavum:                 Appears normal         Ductal Arch:            Previously seen  Ventricles:            Appears normal         Diaphragm:              Appears normal  Choroid Plexus:        Previously seen        Stomach:                Appears normal, left                                                                        sided  Cerebellum:            Previously seen        Abdomen:                Appears normal  Posterior Fossa:       Previously seen        Abdominal Wall:         Previously seen  Nuchal Fold:           Previously seen        Cord Vessels:           Previously seen  Face:                  Orbits and profile     Kidneys:                Appear normal                         previously seen  Lips:                  Previously seen        Bladder:                Appears normal  Thoracic:              Appears normal         Spine:                  Previously seen  Heart:  Previously seen        Upper Extremities:      Previously seen  RVOT:                  Previously seen        Lower Extremities:      Previously seen  LVOT:                  Previously seen ---------------------------------------------------------------------- Cervix Uterus Adnexa  Cervix  Not visualized (advanced GA >24wks) ---------------------------------------------------------------------- Comments  U/S images reviewed. Findings reviewed with patient.   Accelerated fetal growth is noted.   No fetal abnormalities are  seen.  No evidence of fetal compromise is found on BPP  today.  BP - 143/87 with repeat of 164/99.  Patient reports having a  headache now but denies visual disturbance, mid-epigastric  or RUQ pain.  OB paged.  Patient states that she was told by  her OB earlier today that her BP was elevated and that if it  was still elevated during her U/S, MFM would send her to  MAU for evaluation. Questions answered.  20 minutes spent face to face with patient.  Recommendations: 1) To MAU for PIH labs 2) Weekly BPP  3) Serial U/S every 4 weeks for fetal growth  NB -if pre-eclampsia/gestational hypertension is diagnosed,  patient should be delivered @ 37 weeks ---------------------------------------------------------------------- Recommendations  1) To MAU for PIH labs 2) Weekly BPP  3) Serial U/S every 4  weeks for fetal growth ----------------------------------------------------------------------               Lajoyce Lauber, MD Electronically Signed Final Report   07/10/2018 06:18 pm ----------------------------------------------------------------------  [place] Mfm Ob Follow Up  Result Date: 07/10/2018 ----------------------------------------------------------------------  OBSTETRICS REPORT                       (Signed Final 07/10/2018 06:18 pm) ---------------------------------------------------------------------- Patient Info  ID #:       [id number]                          D.O.B.:  [birth year] (42 yrs)  Name:       [name]                  Visit Date: 07/10/2018 04:58 pm ---------------------------------------------------------------------- Performed By  Performed By:     Novella Rob        Ref. Address:     [address] [place].  [place], [place]  Attending:        Lajoyce Lauber      Location:         [facility]                    MD  Referred By:      Alanda Slim                    Aurianna Earlywine MD ---------------------------------------------------------------------- Orders   #  Description                          Code         Ordered By   1  [place] MFM OB FOLLOW UP                  [id number]     Lora Paula   2  [place] MFM FETAL BPP WO NON              [id number]     [place]  ----------------------------------------------------------------------   #  Order #                    Accession #                 Episode #   1  [id number]                  [id number]   2  [id number]                  [id number]                  [id number]  ---------------------------------------------------------------------- Indications   [redacted] weeks gestation of pregnancy                Z3A.68   Advanced maternal age multigravida 2+,        O73.523   third trimester   Abnormal biochemical screen (elevated AFP)     O28.9   History of cesarean delivery, currently        O73.219   pregnant x 2   Gestational diabetes in pregnancy, insulin     O24.414   controlled   Poor obstetric history: Previous preterm       O09.219   delivery, antepartum (@[redacted]w[redacted]d )  ---------------------------------------------------------------------- Vital Signs  Weight (lb): 227                               Height:  5'4"  BMI:         38.96 ---------------------------------------------------------------------- Fetal Evaluation  Num Of Fetuses:         1  Fetal Heart Rate(bpm):  159  Cardiac Activity:       Observed  Presentation:           Cephalic  Placenta:               Posterior  P. Cord Insertion:      Previously Visualized  Amniotic Fluid  AFI FV:      Within normal limits  AFI Sum(cm)     %Tile       Largest  Pocket(cm)  12.27           35          4.65  RUQ(cm)       RLQ(cm)       LUQ(cm)        LLQ(cm)  4.65          0             3.7            3.92 ---------------------------------------------------------------------- Biophysical Evaluation  Amniotic F.V:   Within normal limits       F. Tone:        Observed  F. Movement:    Observed                   Score:          8/8  F. Breathing:   Observed ---------------------------------------------------------------------- Biometry  BPD:        86  mm     G. Age:  34w 5d         74  %    CI:         75.4   %    70 - 86                                                          FL/HC:      20.2   %    19.4 - 21.8  HC:      314.1  mm     G. Age:  35w 1d         52  %    HC/AC:      0.91        0.96 - 1.11  AC:      346.7  mm     G. Age:  38w 4d       > 97  %    FL/BPD:     74.0   %    71 - 87  FL:       63.6  mm     G. Age:  32w 6d         20  %    FL/AC:      18.3   %    20 - 24  HUM:        59  mm     G. Age:  34w 1d         68  %  Est. FW:    2938  gm      6 lb 8 oz   > 90  % ----------------------------------------------------------------------  OB History  Gravidity:    6         Term:   1        Prem:   1        SAB:   3  TOP:          0       Ectopic:  0        Living: 2 ---------------------------------------------------------------------- Gestational Age  LMP:           25w 5d        Date:  11/16/17                 EDD:   08/23/18  U/S Today:     35w 2d                                        EDD:   08/12/18  Best:          33w 5d     Det. By:  LMP  (11/16/17)          EDD:   08/23/18 ---------------------------------------------------------------------- Anatomy  Cranium:               Appears normal         Aortic Arch:            Previously seen  Cavum:                 Appears normal         Ductal Arch:            Previously seen  Ventricles:            Appears normal         Diaphragm:              Appears normal  Choroid Plexus:        Previously seen         Stomach:                Appears normal, left                                                                        sided  Cerebellum:            Previously seen        Abdomen:                Appears normal  Posterior Fossa:       Previously seen        Abdominal Wall:         Previously seen  Nuchal Fold:           Previously seen        Cord Vessels:           Previously seen  Face:                  Orbits and profile     Kidneys:                Appear normal  previously seen  Lips:                  Previously seen        Bladder:                Appears normal  Thoracic:              Appears normal         Spine:                  Previously seen  Heart:                 Previously seen        Upper Extremities:      Previously seen  RVOT:                  Previously seen        Lower Extremities:      Previously seen  LVOT:                  Previously seen ---------------------------------------------------------------------- Cervix Uterus Adnexa  Cervix  Not visualized (advanced GA >24wks) ---------------------------------------------------------------------- Comments  U/S images reviewed. Findings reviewed with patient.  Accelerated fetal growth is noted.   No fetal abnormalities are  seen.  No evidence of fetal compromise is found on BPP  today.  BP - 143/87 with repeat of 164/99.  Patient reports having a  headache now but denies visual disturbance, mid-epigastric  or RUQ pain.  OB paged.  Patient states that she was told by  her OB earlier today that her BP was elevated and that if it  was still elevated during her U/S, MFM would send her to  MAU for evaluation. Questions answered.  20 minutes spent face to face with patient.  Recommendations: 1) To MAU for PIH labs 2) Weekly BPP  3) Serial U/S every 4 weeks for fetal growth  NB -if pre-eclampsia/gestational hypertension is diagnosed,  patient should be delivered @ 37 weeks  ---------------------------------------------------------------------- Recommendations  1) To MAU for PIH labs 2) Weekly BPP  3) Serial U/S every 4  weeks for fetal growth ----------------------------------------------------------------------               Lajoyce Lauber, MD Electronically Signed Final Report   07/10/2018 06:18 pm ----------------------------------------------------------------------  Assessment/Plan: Gestational hypertension with severe features Class A2GDM IUP@ 33 5/7 weeks Hx PTB on 17 OHP weekly last dose yesterday Unexplained elev AFP AMA>40 Previous C/S x 2  P) admit magnesium sulfate IV. NICU consult. Defer betametasone due to GDM unless imminent delivery. Hypertensive protocol( will give hydralazine now and po hydralazine. Cont insulin regimen. CBG monitoring. Continuous fetal monitoring    Quantavious Eggert A Christia Domke 07/11/2018, 5:44 AM

## 2018-07-11 NOTE — Progress Notes (Signed)
Pt feeling good, wondering if she can go home Denies any vaginal bleeding, lof, ctx - though says she felt ctx last night but when the mag sulfate was started the ctx seemed to go away  Patient Vitals for the past 24 hrs:  BP Temp Temp src Pulse Resp SpO2 Height Weight  07/11/18 0700 (!) 143/93 - - 92 16 - - -  07/11/18 0600 136/87 - - 91 18 - - -  07/11/18 0500 122/83 98 F (36.7 C) Axillary 89 18 - - -  07/11/18 0400 111/60 - - 94 18 - - -  07/11/18 0300 99/86 - - 95 18 - - -  07/11/18 0200 131/78 - - 95 18 - - -  07/11/18 0100 128/74 (!) 97.4 F (36.3 C) Axillary 95 18 - - -  07/11/18 0000 135/78 - - 93 18 - - -  07/10/18 2300 (!) 141/74 - - 84 18 - - -  07/10/18 2200 (!) 156/76 97.7 F (36.5 C) Axillary 89 18 - - -  07/10/18 2105 (!) 148/76 - - 95 18 - - -  07/10/18 2020 - - - - - 100 % - -  07/10/18 2015 - - - - - 100 % - -  07/10/18 2010 (!) 145/86 - - 84 18 100 % - -  07/10/18 2005 - - - - - 100 % - -  07/10/18 2000 134/69 - - 84 18 100 % - -  07/10/18 1950 (!) 144/85 - - 80 18 - - -  07/10/18 1949 (!) 153/84 - - 80 18 - - -  07/10/18 1937 (!) 152/89 98.3 F (36.8 C) Oral 76 18 - - -  07/10/18 1922 (!) 152/81 - - 85 - - - -  07/10/18 1916 (!) 149/80 - - 74 - - - -  07/10/18 1901 (!) 150/85 - - 68 - - - -  07/10/18 1846 (!) 149/100 - - 70 - - - -  07/10/18 1838 (!) 160/94 - - 83 - - - -  07/10/18 1810 (!) 167/106 (!) 97.4 F (36.3 C) Oral 75 16 100 % 5\' 3"  (1.6 m) 102.5 kg    Intake/Output Summary (Last 24 hours) at 07/11/2018 0933 Last data filed at 07/11/2018 0900 Gross per 24 hour  Intake 1514.12 ml  Output 2300 ml  Net -785.88 ml   A&ox3 rrr ctab Abd: soft, nt, gravid LE: trace to +1 edema, nt bilat  FHT: 120s, nml variability, +accels, no decels TOCO: rare ctx, about q 110min to 1 hr  Results for orders placed or performed during the hospital encounter of 07/10/18 (from the past 24 hour(s))  Protein / creatinine ratio, urine     Status: Abnormal   Collection Time: 07/10/18  6:18 PM  Result Value Ref Range   Creatinine, Urine 64.00 mg/dL   Total Protein, Urine 13 mg/dL   Protein Creatinine Ratio 0.20 (H) 0.00 - 0.15 mg/mg[Cre]  CBC     Status: Abnormal   Collection Time: 07/10/18  6:25 PM  Result Value Ref Range   WBC 7.1 4.0 - 10.5 K/uL   RBC 4.12 3.87 - 5.11 MIL/uL   Hemoglobin 10.4 (L) 12.0 - 15.0 g/dL   HCT 31.8 (L) 36.0 - 46.0 %   MCV 77.2 (L) 80.0 - 100.0 fL   MCH 25.2 (L) 26.0 - 34.0 pg   MCHC 32.7 30.0 - 36.0 g/dL   RDW 14.9 11.5 - 15.5 %   Platelets 225 150 - 400 K/uL  nRBC 0.0 0.0 - 0.2 %  Comprehensive metabolic panel     Status: Abnormal   Collection Time: 07/10/18  6:25 PM  Result Value Ref Range   Sodium 137 135 - 145 mmol/L   Potassium 3.7 3.5 - 5.1 mmol/L   Chloride 110 98 - 111 mmol/L   CO2 19 (L) 22 - 32 mmol/L   Glucose, Bld 88 70 - 99 mg/dL   BUN 5 (L) 6 - 20 mg/dL   Creatinine, Ser 0.41 (L) 0.44 - 1.00 mg/dL   Calcium 8.5 (L) 8.9 - 10.3 mg/dL   Total Protein 6.1 (L) 6.5 - 8.1 g/dL   Albumin 2.8 (L) 3.5 - 5.0 g/dL   AST 22 15 - 41 U/L   ALT 15 0 - 44 U/L   Alkaline Phosphatase 66 38 - 126 U/L   Total Bilirubin 0.3 0.3 - 1.2 mg/dL   GFR calc non Af Amer >60 >60 mL/min   GFR calc Af Amer >60 >60 mL/min   Anion gap 8 5 - 15  Uric acid     Status: None   Collection Time: 07/10/18  6:25 PM  Result Value Ref Range   Uric Acid, Serum 4.2 2.5 - 7.1 mg/dL  RPR     Status: None   Collection Time: 07/10/18  7:01 PM  Result Value Ref Range   RPR Ser Ql Non Reactive Non Reactive  Type and screen Desha     Status: None   Collection Time: 07/10/18  7:01 PM  Result Value Ref Range   ABO/RH(D) A POS    Antibody Screen NEG    Sample Expiration      07/13/2018 Performed at [facility], [address], [place], [place] [address]   Glucose, capillary     Status: None   Collection Time: 07/10/18  8:22 PM  Result Value Ref Range   Glucose-Capillary 78 70 - 99 mg/dL  Culture,  beta strep (group b only)     Status: None (Preliminary result)   Collection Time: 07/10/18  9:12 PM  Result Value Ref Range   Specimen Description      VAGINAL/RECTAL Performed at [facility], [address] [place], [place] [address]    Special Requests      NONE Performed at [facility], [address], [place], [place] [address]    Culture      CULTURE REINCUBATED FOR BETTER GROWTH Performed at [facility], [address], [place], [place] [address]    Report Status PENDING   Glucose, capillary     Status: Abnormal   Collection Time: 07/10/18 11:17 PM  Result Value Ref Range   Glucose-Capillary 113 (H) 70 - 99 mg/dL  Glucose, capillary     Status: Abnormal   Collection Time: 07/11/18  6:22 AM  Result Value Ref Range   Glucose-Capillary 103 (H) 70 - 99 mg/dL   A/P: iup at 33.6 wga 1. Admitted for [facility] with severe features, bp x2 in severe range on admit and  pt was on mag sulfate x12 hrs which has now been stopped; pt was on hydralazine 50mg  po tid; bps have subsequently decreased and in last 9 hrs or so have been low mild range to normal, no further severe range bp after initial 2; pt is asymptomatic; bp meds now changed to procardia 30mg  XL q day; labs wnl 2. GDMA2, on insulin - bs controlled and contin current regimen 3. H/o ptd - on makenq q  wk, had last dose 1 day ago 4. ama 5. Unexplained elevated afp 6. H/o c/x x2 7.fetal status reassuring, bpp yesterday 8/8; efw >90% (6'8") yesterday; contin efm 8. Prematurity - deferring BMZ unless imminent delivery d/t GDM, immenient delivery not currently planned

## 2018-07-11 NOTE — Progress Notes (Signed)
Pt to be transported upstairs due to high census on L+D.  POC remains to monitor BP's while on Procardia XL and see about D/C home tonight.

## 2018-07-12 LAB — CULTURE, BETA STREP (GROUP B ONLY)

## 2018-07-12 MED ORDER — NIFEDIPINE ER 30 MG PO TB24: 30.0000 mg | ORAL_TABLET | Freq: Every day | ORAL | 6 refills | Status: DC

## 2018-07-12 NOTE — Progress Notes (Signed)
Pt refused fetal monitoring this morning. Pt educated on the importance.Toya Smothers, RN

## 2018-07-12 NOTE — Progress Notes (Signed)
S: feels ok Denies ha, visual changes or epigastric pain   O: BP (!) 148/85 (BP Location: Left Arm)   Pulse 84   Temp 98.6 F (37 C) (Oral)   Resp 18   Ht 5\' 3"  (1.6 m)   Wt 97.7 kg   LMP 11/16/2017   SpO2 100%   BMI 38.15 kg/m  Patient Vitals for the past 24 hrs:  BP Temp Temp src Pulse Resp SpO2 Weight  07/12/18 0459 - - - - - - 97.7 kg  07/12/18 0458 (!) 148/85 98.6 F (37 C) Oral 84 18 100 % -  07/11/18 2300 (!) 145/90 98.3 F (36.8 C) Oral 82 20 100 % -  07/11/18 2012 137/87 98.2 F (36.8 C) Oral 79 19 100 % -  07/11/18 1558 (!) 141/81 97.6 F (36.4 C) Oral 86 16 100 % -  07/11/18 1335 (!) 144/89 98.3 F (36.8 C) Oral 88 15 100 % -  07/11/18 1305 119/67 - - 96 16 - -  07/11/18 1200 (!) 147/85 - - (!) 101 16 - -  07/11/18 0958 (!) 143/83 - - 89 16 - -  lungs clear to A Cor RRR Abd gravid non tender Ext no edema  DTR 2+ no clonus  Tracing cat 1 baseline 135 (+) Accel to 150 irreg ctx CBG: 103/82/126/105  IMP: Gestational HTN on procardia XL s/p magnesium and one severe range BP. 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 OOW. Cont with procardia. Preeclampsia warning signs reviewed F/u appt as scheduled

## 2018-07-12 NOTE — Progress Notes (Signed)
Pt discharged with printed instructions. Pt verbalized an understanding. Toya Smothers, RN

## 2018-07-15 NOTE — Discharge Summary (Signed)
Physician Discharge Summary  Patient ID: HAIDY KACKLEY MRN: [id number] DOB/AGE: [birth year] 42 y.o.  Admit date: 07/10/2018 Discharge date: 07/12/2018  Admission Diagnoses: Gestational HTN IUP@ 33 5/7 weeks Class A2 GDM Hx PTB AMA Previous C/S Unexplained elev MSAFP  Discharge Diagnoses: Gestationa HTN, Class A2 GDM, IUP@ 34 weeks AMA Previous C/S Unexplained elevated MSAFP  Active Problems:   Gestational htn w/o significant proteinuria, third trimester   Discharged Condition: stable  Hospital Course: pt was admitted after severe range BP noted. [place] labs were done and showed no evidence of preeclampsia. Pt was started on Magnesium sulfate. She was given IV hydralazine and changed to oral hydralazine. Magnesium was discontinued when no further severe BP range noted. Pt was switched to procardia Xl when BP was lowered. BMZ was deferred due to not imminent delivery and underlying GDM  Consults: None  Significant Diagnostic Studies: labs:  CBC Latest Ref Rng & Units 07/10/2018 09/27/2017 07/19/2016  WBC 4.0 - 10.5 K/uL 7.1 6.0 8.9  Hemoglobin 12.0 - 15.0 g/dL 10.4(L) 12.1 9.6(L)  Hematocrit 36.0 - 46.0 % 31.8(L) 35.6 27.5(L)  Platelets 150 - 400 K/uL 225 351 224   CMP Latest Ref Rng & Units 07/10/2018 09/27/2017 07/19/2016  Glucose 70 - 99 mg/dL 88 90 78  BUN 6 - 20 mg/dL 5(L) 8 11  Creatinine 0.44 - 1.00 mg/dL 0.41(L) 0.60 0.64  Sodium 135 - 145 mmol/L 137 141 132(L)  Potassium 3.5 - 5.1 mmol/L 3.7 3.8 3.8  Chloride 98 - 111 mmol/L 110 105 104  CO2 22 - 32 mmol/L 19(L) 19(L) 21(L)  Calcium 8.9 - 10.3 mg/dL 8.5(L) 8.8 8.4(L)  Total Protein 6.5 - 8.1 g/dL 6.1(L) 7.1 5.8(L)  Total Bilirubin 0.3 - 1.2 mg/dL 0.3 0.3 0.5  Alkaline Phos 38 - 126 U/L 66 55 86  AST 15 - 41 U/L 22 16 30   ALT 0 - 44 U/L 15 14 29      Treatments: Magnesium, hydralazine  Discharge Exam: Blood pressure (!) 155/94, pulse 79, temperature 99 F (37.2 C), temperature source Oral, resp. rate 18, height  5\' 3"  (1.6 m), weight 97.7 kg, last menstrual period 11/16/2017, SpO2 100 %, unknown if currently breastfeeding. General appearance: alert, cooperative and no distress Resp: clear to auscultation bilaterally Cardio: regular rate and rhythm, S1, S2 normal, no murmur, click, rub or gallop GI: gravid soft Pelvic: deferred Extremities: no edema, redness or tenderness in the calves or thighs  Disposition:   Discharge Instructions    Bed rest   Complete by:  As directed    Diet - low sodium heart healthy   Complete by:  As directed    Discharge instructions   Complete by:  As directed    Preeclampsia warning signs     Allergies as of 07/12/2018      Reactions   Codeine Anaphylaxis      Medication List    TAKE these medications   BASAGLAR KWIKPEN [place] Inject 24 Units into the skin at bedtime.   hydroxyprogesterone caproate 250 mg/mL Oil injection Commonly known as:  MAKENA Inject 250 mg into the muscle once a week.   NIFEdipine 30 MG 24 hr tablet Commonly known as:  ADALAT CC Take 1 tablet (30 mg total) by mouth daily.   NOVOLOG FLEXPEN 100 UNIT/ML FlexPen Generic drug:  insulin aspart Inject 4 Units into the skin 3 (three) times daily.   PRENATAL GUMMIES/DHA & FA 0.4-32.5 MG Chew Chew 2 each by mouth daily.  Follow-up Information    Servando Salina, MD Follow up on 07/17/2018.   Specialty:  Obstetrics and Gynecology Contact information: [address] [place] [address] [phone]           Signed: Alanda Slim A Jeanny Rymer 07/15/2018, 5:26 AM

## 2018-07-16 ENCOUNTER — Other Ambulatory Visit: Payer: Self-pay | Admitting: Obstetrics and Gynecology

## 2018-07-17 ENCOUNTER — Encounter (HOSPITAL_COMMUNITY): Payer: Self-pay

## 2018-07-17 ENCOUNTER — Ambulatory Visit (HOSPITAL_BASED_OUTPATIENT_CLINIC_OR_DEPARTMENT_OTHER)
Admission: RE | Admit: 2018-07-17 | Discharge: 2018-07-17 | Disposition: A | Discharge status: Home / Self Care | Payer: BC Managed Care – PPO | Source: Ambulatory Visit | Attending: Maternal and Fetal Medicine | Admitting: Maternal and Fetal Medicine

## 2018-07-17 ENCOUNTER — Other Ambulatory Visit (HOSPITAL_COMMUNITY): Payer: BC Managed Care – PPO

## 2018-07-17 ENCOUNTER — Ambulatory Visit (HOSPITAL_COMMUNITY): Payer: BC Managed Care – PPO

## 2018-07-17 DIAGNOSIS — O24414 Gestational diabetes mellitus in pregnancy, insulin controlled: Secondary | ICD-10-CM

## 2018-07-17 DIAGNOSIS — Z3A34 34 weeks gestation of pregnancy: Secondary | ICD-10-CM | POA: Insufficient documentation

## 2018-07-17 DIAGNOSIS — O34219 Maternal care for unspecified type scar from previous cesarean delivery: Secondary | ICD-10-CM

## 2018-07-17 DIAGNOSIS — O133 Gestational [pregnancy-induced] hypertension without significant proteinuria, third trimester: Secondary | ICD-10-CM

## 2018-07-17 DIAGNOSIS — O09213 Supervision of pregnancy with history of pre-term labor, third trimester: Secondary | ICD-10-CM

## 2018-07-17 DIAGNOSIS — O09523 Supervision of elderly multigravida, third trimester: Secondary | ICD-10-CM

## 2018-07-17 DIAGNOSIS — O289 Unspecified abnormal findings on antenatal screening of mother: Secondary | ICD-10-CM

## 2018-07-17 DIAGNOSIS — Z794 Long term (current) use of insulin: Secondary | ICD-10-CM

## 2018-07-17 DIAGNOSIS — IMO0001 Reserved for inherently not codable concepts without codable children: Secondary | ICD-10-CM

## 2018-07-17 DIAGNOSIS — O24313 Unspecified pre-existing diabetes mellitus in pregnancy, third trimester: Principal | ICD-10-CM

## 2018-07-18 ENCOUNTER — Other Ambulatory Visit: Payer: Self-pay

## 2018-07-18 ENCOUNTER — Inpatient Hospital Stay (HOSPITAL_COMMUNITY)
Admission: AD | Admit: 2018-07-18 | Discharge: 2018-07-20 | DRG: 831 | Disposition: A | Discharge status: Home / Self Care | Payer: BC Managed Care – PPO | Attending: Obstetrics and Gynecology | Admitting: Obstetrics and Gynecology

## 2018-07-18 ENCOUNTER — Encounter (HOSPITAL_COMMUNITY): Payer: Self-pay | Admitting: *Deleted

## 2018-07-18 ENCOUNTER — Inpatient Hospital Stay (HOSPITAL_BASED_OUTPATIENT_CLINIC_OR_DEPARTMENT_OTHER): Payer: BC Managed Care – PPO

## 2018-07-18 DIAGNOSIS — O24419 Gestational diabetes mellitus in pregnancy, unspecified control: Secondary | ICD-10-CM

## 2018-07-18 DIAGNOSIS — O24414 Gestational diabetes mellitus in pregnancy, insulin controlled: Secondary | ICD-10-CM | POA: Diagnosis present

## 2018-07-18 DIAGNOSIS — O99820 Streptococcus B carrier state complicating pregnancy: Secondary | ICD-10-CM | POA: Diagnosis present

## 2018-07-18 DIAGNOSIS — O09523 Supervision of elderly multigravida, third trimester: Secondary | ICD-10-CM | POA: Diagnosis not present

## 2018-07-18 DIAGNOSIS — O133 Gestational [pregnancy-induced] hypertension without significant proteinuria, third trimester: Secondary | ICD-10-CM

## 2018-07-18 DIAGNOSIS — O288 Other abnormal findings on antenatal screening of mother: Secondary | ICD-10-CM

## 2018-07-18 DIAGNOSIS — D509 Iron deficiency anemia, unspecified: Secondary | ICD-10-CM | POA: Diagnosis present

## 2018-07-18 DIAGNOSIS — O368330 Maternal care for abnormalities of the fetal heart rate or rhythm, third trimester, not applicable or unspecified: Principal | ICD-10-CM | POA: Diagnosis present

## 2018-07-18 DIAGNOSIS — Z3A34 34 weeks gestation of pregnancy: Secondary | ICD-10-CM

## 2018-07-18 DIAGNOSIS — O09213 Supervision of pregnancy with history of pre-term labor, third trimester: Secondary | ICD-10-CM | POA: Diagnosis not present

## 2018-07-18 DIAGNOSIS — O289 Unspecified abnormal findings on antenatal screening of mother: Secondary | ICD-10-CM

## 2018-07-18 DIAGNOSIS — O99013 Anemia complicating pregnancy, third trimester: Secondary | ICD-10-CM | POA: Diagnosis present

## 2018-07-18 DIAGNOSIS — O34219 Maternal care for unspecified type scar from previous cesarean delivery: Secondary | ICD-10-CM | POA: Diagnosis not present

## 2018-07-18 DIAGNOSIS — O368390 Maternal care for abnormalities of the fetal heart rate or rhythm, unspecified trimester, not applicable or unspecified: Secondary | ICD-10-CM | POA: Diagnosis present

## 2018-07-18 LAB — COMPREHENSIVE METABOLIC PANEL
ALT: 13 U/L (ref 0–44)
AST: 23 U/L (ref 15–41)
Albumin: 2.6 g/dL — ABNORMAL LOW (ref 3.5–5.0)
Alkaline Phosphatase: 64 U/L (ref 38–126)
Anion gap: 9 (ref 5–15)
BUN: 7 mg/dL (ref 6–20)
CO2: 18 mmol/L — ABNORMAL LOW (ref 22–32)
Calcium: 8.6 mg/dL — ABNORMAL LOW (ref 8.9–10.3)
Chloride: 109 mmol/L (ref 98–111)
Creatinine, Ser: 0.49 mg/dL (ref 0.44–1.00)
GFR calc Af Amer: >60 mL/min (ref >60)
GFR calc non Af Amer: >60 mL/min (ref >60)
Glucose, Bld: 117 mg/dL — ABNORMAL HIGH (ref 70–99)
Potassium: 3.3 mmol/L — ABNORMAL LOW (ref 3.5–5.1)
Sodium: 136 mmol/L (ref 135–145)
Total Bilirubin: 0.5 mg/dL (ref 0.3–1.2)
Total Protein: 5.7 g/dL — ABNORMAL LOW (ref 6.5–8.1)

## 2018-07-18 LAB — CBC
HCT: 30.6 % — ABNORMAL LOW (ref 36.0–46.0)
Hemoglobin: 9.8 g/dL — ABNORMAL LOW (ref 12.0–15.0)
MCH: 24.6 pg — ABNORMAL LOW (ref 26.0–34.0)
MCHC: 32 g/dL (ref 30.0–36.0)
MCV: 76.7 fL — ABNORMAL LOW (ref 80.0–100.0)
Platelets: 222 10*3/uL (ref 150–400)
RBC: 3.99 MIL/uL (ref 3.87–5.11)
RDW: 15.4 % (ref 11.5–15.5)
WBC: 7 10*3/uL (ref 4.0–10.5)
nRBC: 0 % (ref 0.0–0.2)

## 2018-07-18 LAB — TYPE AND SCREEN
ABO/RH(D): A POS
Antibody Screen: NEGATIVE

## 2018-07-18 LAB — GLUCOSE, CAPILLARY: Glucose-Capillary: 99 mg/dL (ref 70–99)

## 2018-07-18 LAB — PROTEIN / CREATININE RATIO, URINE
Creatinine, Urine: 104 mg/dL
Protein Creatinine Ratio: 0.21 mg/mg{Cre} — ABNORMAL HIGH (ref 0.00–0.15)
Total Protein, Urine: 22 mg/dL

## 2018-07-18 LAB — URIC ACID: Uric Acid, Serum: 5 mg/dL (ref 2.5–7.1)

## 2018-07-18 MED ORDER — PRENATAL MULTIVITAMIN CH: 1.0000 | ORAL_TABLET | Freq: Every day | ORAL | Status: DC

## 2018-07-18 MED ORDER — POTASSIUM CHLORIDE CRYS ER 20 MEQ PO TBCR
40.0000 meq | EXTENDED_RELEASE_TABLET | Freq: Two times a day (BID) | ORAL | Status: DC
  Administered 2018-07-18 – 2018-07-20 (×4): 40 meq via ORAL
  Filled 2018-07-18 (×4): qty 2

## 2018-07-18 MED ORDER — INSULIN ASPART 100 UNIT/ML ~~LOC~~ SOLN
4.0000 [IU] | Freq: Three times a day (TID) | SUBCUTANEOUS | Status: DC
  Administered 2018-07-18 – 2018-07-20 (×5): 4 [IU] via SUBCUTANEOUS

## 2018-07-18 MED ORDER — PRENATAL GUMMIES/DHA & FA 0.4-32.5 MG PO CHEW: 2.0000 | CHEWABLE_TABLET | Freq: Every day | ORAL | Status: DC

## 2018-07-18 MED ORDER — TERBUTALINE SULFATE 1 MG/ML IJ SOLN
0.2500 mg | Freq: Once | INTRAMUSCULAR | Status: AC
  Administered 2018-07-18: 0.25 mg via SUBCUTANEOUS
  Filled 2018-07-18: qty 1

## 2018-07-18 MED ORDER — HYDROXYPROGESTERONE CAPROATE 250 MG/ML IM OIL: 250.0000 mg | TOPICAL_OIL | INTRAMUSCULAR | Status: DC

## 2018-07-18 MED ORDER — ZOLPIDEM TARTRATE 5 MG PO TABS: 5.0000 mg | ORAL_TABLET | Freq: Every evening | ORAL | Status: DC | PRN

## 2018-07-18 MED ORDER — COMPLETENATE 29-1 MG PO CHEW
1.0000 | CHEWABLE_TABLET | Freq: Every day | ORAL | Status: DC
  Administered 2018-07-19: 1 via ORAL
  Filled 2018-07-18 (×2): qty 1

## 2018-07-18 MED ORDER — NIFEDIPINE ER OSMOTIC RELEASE 30 MG PO TB24: 30.0000 mg | ORAL_TABLET | Freq: Every day | ORAL | Status: DC

## 2018-07-18 MED ORDER — ACETAMINOPHEN 325 MG PO TABS: 650.0000 mg | ORAL_TABLET | ORAL | Status: DC | PRN

## 2018-07-18 MED ORDER — DOCUSATE SODIUM 100 MG PO CAPS
100.0000 mg | ORAL_CAPSULE | Freq: Every day | ORAL | Status: DC
  Administered 2018-07-19 – 2018-07-20 (×2): 100 mg via ORAL
  Filled 2018-07-18 (×2): qty 1

## 2018-07-18 MED ORDER — CALCIUM CARBONATE ANTACID 500 MG PO CHEW: 2.0000 | CHEWABLE_TABLET | ORAL | Status: DC | PRN

## 2018-07-18 MED ORDER — INSULIN GLARGINE 100 UNIT/ML ~~LOC~~ SOLN
24.0000 [IU] | Freq: Every day | SUBCUTANEOUS | Status: DC
  Administered 2018-07-18 – 2018-07-19 (×2): 24 [IU] via SUBCUTANEOUS
  Filled 2018-07-18 (×2): qty 0.24

## 2018-07-18 MED ORDER — BETAMETHASONE SOD PHOS & ACET 6 (3-3) MG/ML IJ SUSP
12.0000 mg | INTRAMUSCULAR | Status: AC
  Administered 2018-07-18 – 2018-07-19 (×2): 12 mg via INTRAMUSCULAR
  Filled 2018-07-18 (×2): qty 2

## 2018-07-18 MED ORDER — LACTATED RINGERS IV SOLN
INTRAVENOUS | Status: DC
  Administered 2018-07-18 – 2018-07-19 (×2): via INTRAVENOUS

## 2018-07-18 MED ORDER — NIFEDIPINE ER OSMOTIC RELEASE 30 MG PO TB24
30.0000 mg | ORAL_TABLET | Freq: Every day | ORAL | Status: DC
  Administered 2018-07-18 – 2018-07-19 (×2): 30 mg via ORAL
  Filled 2018-07-18 (×2): qty 1

## 2018-07-18 NOTE — Progress Notes (Signed)
Report called to [facility] charge.  Waiting for callback on room assignment.

## 2018-07-18 NOTE — H&P (Signed)
[name] is a 42 y.o. female presenting for admission due to deceleration noted in MAU. Pt was sent to MAU in the first place due to c/o persistent h/a despite taking tylenol . She was being followed for gestational HTn and Class A2 GDm. Pt denies blurry vision or epigastric pain. Denies n/v/abdominal pain  OB History    Gravida  6   Para  2   Term  1   Preterm  1   AB  3   Living  2     SAB  3   TAB      Ectopic      Multiple  0   Live Births  1          Past Medical History:  Diagnosis Date  . Diabetes mellitus without complication (HCC)    gestational  . Gestational diabetes   . Postpartum care following cesarean delivery Indication: Repeat, SROM (12/11) 07/17/2016   Past Surgical History:  Procedure Laterality Date  . BREAST LUMPECTOMY    . BREAST SURGERY    . CESAREAN SECTION    . CESAREAN SECTION N/A 07/17/2016   Procedure: CESAREAN SECTION;  Surgeon: Melanie Salina, MD;  Location: [name];  Service: Obstetrics;  Laterality: N/A;   Family History: family history includes Diabetes in her brother, mother, and sister; Heart disease in her brother; Hypertension in her mother. Social History:  reports that she has never smoked. She has never used smokeless tobacco. She reports previous alcohol use. She reports that she does not use drugs.     Maternal Diabetes: Yes:  Diabetes Type:  Insulin/Medication controlled Genetic Screening: Abnormal:  Results: Elevated AFP Maternal Ultrasounds/Referrals: Normal Fetal Ultrasounds or other Referrals:  Fetal echo, Referred to Materal Fetal Medicine  Maternal Substance Abuse:  No Significant Maternal Medications:  Meds include: Other: insulin, procardia. Makena  Significant Maternal Lab Results:  Lab values include: Group B Strep positive Other Comments:  AMA, Class A2 GDM. hx PTB on Makena, Gestational HTN  Review of Systems  Eyes: Negative for blurred vision and double vision.  Cardiovascular:  Negative for leg swelling.  Gastrointestinal: Negative for heartburn.  Neurological: Positive for headaches.   History   Blood pressure 134/78, pulse 97, temperature 98.7 F (37.1 C), temperature source Oral, resp. rate 16, height 5\' 3"  (1.6 m), weight 102.5 kg, last menstrual period 11/16/2017, SpO2 98 %, unknown if currently breastfeeding. Exam Physical Exam  Constitutional: She is oriented to person, place, and time. She appears well-developed and well-nourished.  HENT:  Head: Atraumatic.  Eyes: EOM are normal.  Neck: Neck supple.  Cardiovascular: Regular rhythm.  Respiratory: Breath sounds normal.  GI: Soft.  Musculoskeletal:        General: Edema present.  Neurological: She is alert and oriented to person, place, and time. She has normal reflexes.  Skin: Skin is warm and dry.  Psychiatric: She has a normal mood and affect.    Prenatal labs: ABO, Rh: --/--/A POS (12/04 1901) Antibody: NEG (12/04 1901) Rubella: Immune (06/28 0000) RPR: Non Reactive (12/04 1901)  HBsAg: Negative (06/28 0000)  HIV: Non-reactive (06/28 0000)  GBS:  positive  Tracing: starting at baseline 180 (+) deceleration down to  [place] of 60's with slow return to baseline  150  After ctx. Min variability  Assessment/Plan: Fetal heart rate deceleration Gestational HTN on procardia Headache Class A2 GDM Previous C/S x 2 HX PTB on weekly makena IUP @ 34 6/7 weeks Unexplained elev MSAFP AMA  P) admit continuous fetal monitoring. Cont procardia. [place] labs. BMZ. Given possible need to deliver.. cont with insulin.    [name] 07/18/2018, 6:17 PM   Addendum: CBC Latest Ref Rng & Units 07/18/2018 07/10/2018 09/27/2017  WBC 4.0 - 10.5 K/uL 7.0 7.1 6.0  Hemoglobin 12.0 - 15.0 g/dL 9.8(L) 10.4(L) 12.1  Hematocrit 36.0 - 46.0 % 30.6(L) 31.8(L) 35.6  Platelets 150 - 400 K/uL 222 225 351   CMP Latest Ref Rng & Units 07/18/2018 07/10/2018 09/27/2017  Glucose 70 - 99 mg/dL 117(H) 88 90  BUN 6 -  20 mg/dL 7 5(L) 8  Creatinine 0.44 - 1.00 mg/dL 0.49 0.41(L) 0.60  Sodium 135 - 145 mmol/L 136 137 141  Potassium 3.5 - 5.1 mmol/L 3.3(L) 3.7 3.8  Chloride 98 - 111 mmol/L 109 110 105  CO2 22 - 32 mmol/L 18(L) 19(L) 19(L)  Calcium 8.9 - 10.3 mg/dL 8.6(L) 8.5(L) 8.8  Total Protein 6.5 - 8.1 g/dL 5.7(L) 6.1(L) 7.1  Total Bilirubin 0.3 - 1.2 mg/dL 0.5 0.3 0.3  Alkaline Phos 38 - 126 U/L 64 66 55  AST 15 - 41 U/L 23 22 16   ALT 0 - 44 U/L 13 15 14   urine protein/creatinine ratio: 0.21

## 2018-07-18 NOTE — MAU Note (Signed)
Urine in he lab

## 2018-07-18 NOTE — MAU Note (Signed)
Pt reports she has had a headache and RUQ pain this pm, took tylenol without relief.

## 2018-07-19 ENCOUNTER — Encounter (HOSPITAL_COMMUNITY): Payer: Self-pay

## 2018-07-19 LAB — GLUCOSE, CAPILLARY
Glucose-Capillary: 128 mg/dL — ABNORMAL HIGH (ref 70–99)
Glucose-Capillary: 104 mg/dL — ABNORMAL HIGH (ref 70–99)
Glucose-Capillary: 88 mg/dL (ref 70–99)
Glucose-Capillary: 94 mg/dL (ref 70–99)

## 2018-07-19 LAB — RPR: RPR Ser Ql: NONREACTIVE

## 2018-07-19 NOTE — Progress Notes (Signed)
S: worried about delivering early Good FM Does not feel ctx H/a better BMZ #1 yesterday O:  Patient Vitals for the past 24 hrs:  BP Temp Temp src Pulse Resp SpO2 Height Weight  07/19/18 0604 (!) 147/80 98.9 F (37.2 C) Oral 90 18 100 % - -  07/19/18 0600 - - - - - - - 101.2 kg  07/18/18 2331 130/84 98.3 F (36.8 C) Oral 98 18 100 % - -  07/18/18 1932 (!) 148/80 97.8 F (36.6 C) Oral 76 18 100 % - -  07/18/18 1853 (!) 165/93 98.6 F (37 C) - 79 18 100 % - -  07/18/18 1800 137/85 - - 84 - - - -  07/18/18 1728 134/78 98.7 F (37.1 C) Oral 97 16 98 % 5\' 3"  (1.6 m) 102.5 kg  lungs clear to A Cor RRR Abd gravid nontender Pelvic. Cervix closed/long/OOP Extr; tr edema  Tracing: baseline  120 (+) accels to 140 good variability Ctx spaced  CBC Latest Ref Rng & Units 07/18/2018 07/10/2018 09/27/2017  WBC 4.0 - 10.5 K/uL 7.0 7.1 6.0  Hemoglobin 12.0 - 15.0 g/dL 9.8(L) 10.4(L) 12.1  Hematocrit 36.0 - 46.0 % 30.6(L) 31.8(L) 35.6  Platelets 150 - 400 K/uL 222 225 351   CBG (last 3)  CMP Latest Ref Rng & Units 07/18/2018 07/10/2018 09/27/2017  Glucose 70 - 99 mg/dL 117(H) 88 90  BUN 6 - 20 mg/dL 7 5(L) 8  Creatinine 0.44 - 1.00 mg/dL 0.49 0.41(L) 0.60  Sodium 135 - 145 mmol/L 136 137 141  Potassium 3.5 - 5.1 mmol/L 3.3(L) 3.7 3.8  Chloride 98 - 111 mmol/L 109 110 105  CO2 22 - 32 mmol/L 18(L) 19(L) 19(L)  Calcium 8.9 - 10.3 mg/dL 8.6(L) 8.5(L) 8.8  Total Protein 6.5 - 8.1 g/dL 5.7(L) 6.1(L) 7.1  Total Bilirubin 0.3 - 1.2 mg/dL 0.5 0.3 0.3  Alkaline Phos 38 - 126 U/L 64 66 55  AST 15 - 41 U/L 23 22 16   ALT 0 - 44 U/L 13 15 14    Recent Labs    07/18/18 1948 07/19/18 0604  GLUCAP 99 128*  IMP: Reassuring fetal tracing since admit [facility] w/o cervical change Class A2 GDM on insulin Previous C/S x 2 Gestational HTN w/o evidence of preeclampsia IUP@ 35 wk GBS cx positive Unexplained elev MSAFP P) cont monitor. If remains reassuring may d/c home after 2nd dose of BMZ. Anticipate elev  BS given BMZ.  Cont with procardia

## 2018-07-19 NOTE — Progress Notes (Signed)
Initial Nutrition Assessment  DOCUMENTATION CODES:  Obesity unspecified INTERVENTION:  carbohydrate modified gestational diet May order double protein portions Pt recommended to moderate sodium intake at last admission NUTRITION DIAGNOSIS:  Increased nutrient needs related to (pregnancy and fetal growth requirements) as evidenced by (35 weeks IUP). GOAL:  Patient will meet greater than or equal to 90% of their needs MONITOR:  Labs REASON FOR ASSESSMENT:  Antenatal, Gestational Diabetes   ASSESSMENT:  35 weeks, Class A2GDM/HTN. Betamethasone/insulin. Weight at 11 weeks 211 lbs, BMI 37.5   11 lb weight gain to date  Diet Order:   Diet Order            Diet gestational carb mod Room service appropriate? Yes; Fluid consistency: Thin  Diet effective now            EDUCATION NEEDS:  No education needs have been identified at this time(Outpt education 04/10/18) Skin:  Skin Assessment: Reviewed RN Assessment Height:   Ht Readings from Last 1 Encounters:  07/18/18 5\' 3"  (1.6 m)   Weight:   Wt Readings from Last 1 Encounters:  07/19/18 101.2 kg   Ideal Body Weight:   115 lbs BMI:  Body mass index is 39.5 kg/m. Estimated Nutritional Needs:  Kcal:  2000-2200 Protein:  90-100 g Fluid:  2.3 L    Weyman Rodney M.Fredderick Severance LDN Neonatal Nutrition Support Specialist/RD III Pager [phone]      Phone [phone]

## 2018-07-20 ENCOUNTER — Encounter (HOSPITAL_COMMUNITY): Payer: Self-pay | Admitting: *Deleted

## 2018-07-20 LAB — GLUCOSE, CAPILLARY: Glucose-Capillary: 102 mg/dL — ABNORMAL HIGH (ref 70–99)

## 2018-07-20 MED ORDER — NIFEDIPINE ER OSMOTIC RELEASE 30 MG PO TB24
60.0000 mg | ORAL_TABLET | Freq: Every day | ORAL | Status: DC
  Administered 2018-07-20: 60 mg via ORAL
  Filled 2018-07-20: qty 2

## 2018-07-20 MED ORDER — NIFEDIPINE ER 60 MG PO TB24: 60.0000 mg | ORAL_TABLET | Freq: Every day | ORAL | 11 refills | Status: DC

## 2018-07-20 NOTE — Progress Notes (Signed)
HD #3  Feels fines  notes some ctx. Very active baby ? Regarding heart rate and ctx answered C/o urinary frequency O:  Patient Vitals for the past 24 hrs:  BP Temp Temp src Pulse Resp SpO2 Weight  07/20/18 0854 (!) 154/88 98.9 F (37.2 C) Oral (!) 108 17 97 % -  07/20/18 0507 134/70 - - (!) 105 17 100 % 100.6 kg  07/19/18 2332 134/76 98.5 F (36.9 C) - 99 18 97 % -  07/19/18 1943 137/84 97.9 F (36.6 C) Oral 98 16 100 % -  07/19/18 1623 (!) 142/89 99.3 F (37.4 C) Oral 93 16 100 % -  07/19/18 1228 (!) 145/85 98.5 F (36.9 C) Oral 85 17 100 % -  lungs clear to A Cor RRR abd gravid Pelvic closed/50/-3 Extr. No edema or calf tenderness  Tracing: baseline 130 (+) accels to 150. Good variability No ctxs now  IMP: reassuring fetal tracing Class A2 GDM on insulin Gestational HTN on procardia AMA Previous C/S x 2 sched for repeat C/S 37 wks due to BP issue GBS cx positive Unexplained MSAFP P) ucx. Labor prec reviewed. Keep OB appt on Monday. Preeclampsia warning signs. Pt to monitor procardia to see if cause for h/a. D/c home

## 2018-07-20 NOTE — Discharge Summary (Signed)
Physician Discharge Summary  Patient ID: [name] MRN: [id number] DOB/AGE: July 29, 1976 42 y.o.  Admit date: 07/18/2018 Discharge date: 07/20/2018  Admission Diagnoses: fetal heart rate deceleration Class A2 GDM Gestational HTN Previous C/S x 2 IUP@ 34 6/7 weeks Hx PTB Unexplained elevated MSAFP GBS cx positive IDA affecting pregnancy Discharge Diagnoses: same.  [place], IUP @ 35 1/7 weeks Active Problems:   Fetal heart rate decelerations affecting management of mother   Discharged Condition: stable  Hospital Course: pt was admitted after fetal heart deceleration noted on monitor in MAU. Pt was given one dose of [place] terbutaline for ctx.  BMZ x 2 given. No further decelerations noted. BS monitoring was done. She was continued on her insulin regimen  Consults: None  Significant Diagnostic Studies: labs:  CBC Latest Ref Rng & Units 07/18/2018 07/10/2018 09/27/2017  WBC 4.0 - 10.5 K/uL 7.0 7.1 6.0  Hemoglobin 12.0 - 15.0 g/dL 9.8(L) 10.4(L) 12.1  Hematocrit 36.0 - 46.0 % 30.6(L) 31.8(L) 35.6  Platelets 150 - 400 K/uL 222 225 351      Treatments: IV hydration and BMZ  Discharge Exam: Blood pressure (!) 154/88, pulse (!) 108, temperature 98.9 F (37.2 C), temperature source Oral, resp. rate 17, height 5\' 3"  (1.6 m), weight 100.6 kg, last menstrual period 11/16/2017, SpO2 97 %, unknown if currently breastfeeding. General appearance: alert, cooperative and no distress Resp: clear to auscultation bilaterally Cardio: regular rate and rhythm, S1, S2 normal, no murmur, click, rub or gallop GI: gravid non tender Pelvic: closed/long/-3 Extremities: no edema, redness or tenderness in the calves or thighs  Disposition: Discharge disposition: 01-Home or Self Care       Discharge Instructions    Bed rest   Complete by:  As directed    Diet - low sodium heart healthy   Complete by:  As directed    Diet Carb Modified   Complete by:  As directed    Discharge instructions    Complete by:  As directed    Labor precautions, daily kick ct, preeclampsia warning signs     Allergies as of 07/20/2018      Reactions   Codeine Anaphylaxis      Medication List    TAKE these medications   BASAGLAR KWIKPEN  Inject 24 Units into the skin at bedtime.   hydroxyprogesterone caproate 250 mg/mL Oil injection Commonly known as:  MAKENA Inject 250 mg into the muscle once a week.   NIFEdipine 60 MG 24 hr tablet Commonly known as:  ADALAT CC Take 1 tablet (60 mg total) by mouth daily. Start taking on:  July 21, 2018 What changed:    medication strength  how much to take   NOVOLOG FLEXPEN 100 UNIT/ML FlexPen Generic drug:  insulin aspart Inject 4 Units into the skin 3 (three) times daily. 4 units breakfast, 6 units in the evening, plus 1 extra unit for every 50mg /DL above 80mg /DL   PRENATAL GUMMIES/DHA & FA 0.4-32.5 MG Chew Chew 2 each by mouth daily.        Signed: Glendel Jaggers A Haward Pope 07/20/2018, 10:08 AM

## 2018-07-21 LAB — CULTURE, OB URINE
Culture: 30000 — AB
Special Requests: NORMAL

## 2018-07-22 ENCOUNTER — Telehealth (HOSPITAL_COMMUNITY): Payer: Self-pay | Admitting: *Deleted

## 2018-07-22 NOTE — Telephone Encounter (Signed)
Preadmission screen  

## 2018-07-23 ENCOUNTER — Encounter (HOSPITAL_COMMUNITY): Payer: Self-pay

## 2018-07-24 ENCOUNTER — Ambulatory Visit (HOSPITAL_COMMUNITY)
Admission: RE | Admit: 2018-07-24 | Discharge: 2018-07-24 | Disposition: A | Discharge status: Home / Self Care | Payer: BC Managed Care – PPO | Source: Ambulatory Visit | Attending: Family Medicine | Admitting: Family Medicine

## 2018-07-24 ENCOUNTER — Encounter (HOSPITAL_COMMUNITY): Payer: Self-pay

## 2018-07-24 DIAGNOSIS — O139 Gestational [pregnancy-induced] hypertension without significant proteinuria, unspecified trimester: Secondary | ICD-10-CM

## 2018-07-24 DIAGNOSIS — O289 Unspecified abnormal findings on antenatal screening of mother: Secondary | ICD-10-CM | POA: Diagnosis not present

## 2018-07-24 DIAGNOSIS — O24313 Unspecified pre-existing diabetes mellitus in pregnancy, third trimester: Secondary | ICD-10-CM | POA: Diagnosis not present

## 2018-07-24 DIAGNOSIS — O34219 Maternal care for unspecified type scar from previous cesarean delivery: Secondary | ICD-10-CM | POA: Insufficient documentation

## 2018-07-24 DIAGNOSIS — O09213 Supervision of pregnancy with history of pre-term labor, third trimester: Secondary | ICD-10-CM | POA: Diagnosis not present

## 2018-07-24 DIAGNOSIS — O09523 Supervision of elderly multigravida, third trimester: Secondary | ICD-10-CM | POA: Diagnosis not present

## 2018-07-24 DIAGNOSIS — Z794 Long term (current) use of insulin: Secondary | ICD-10-CM

## 2018-07-24 DIAGNOSIS — IMO0001 Reserved for inherently not codable concepts without codable children: Secondary | ICD-10-CM

## 2018-07-24 DIAGNOSIS — O24414 Gestational diabetes mellitus in pregnancy, insulin controlled: Secondary | ICD-10-CM | POA: Insufficient documentation

## 2018-07-24 DIAGNOSIS — Z3A35 35 weeks gestation of pregnancy: Secondary | ICD-10-CM | POA: Diagnosis not present

## 2018-07-24 DIAGNOSIS — O133 Gestational [pregnancy-induced] hypertension without significant proteinuria, third trimester: Secondary | ICD-10-CM | POA: Diagnosis not present

## 2018-07-25 ENCOUNTER — Other Ambulatory Visit (HOSPITAL_COMMUNITY): Payer: Self-pay | Admitting: *Deleted

## 2018-07-25 DIAGNOSIS — Z794 Long term (current) use of insulin: Secondary | ICD-10-CM

## 2018-07-25 DIAGNOSIS — O24119 Pre-existing diabetes mellitus, type 2, in pregnancy, unspecified trimester: Secondary | ICD-10-CM

## 2018-07-26 ENCOUNTER — Other Ambulatory Visit: Payer: Self-pay | Admitting: Obstetrics and Gynecology

## 2018-07-26 ENCOUNTER — Other Ambulatory Visit (HOSPITAL_COMMUNITY)
Admission: AD | Admit: 2018-07-26 | Discharge: 2018-07-26 | Disposition: A | Discharge status: Home / Self Care | Payer: BC Managed Care – PPO | Attending: Obstetrics and Gynecology | Admitting: Obstetrics and Gynecology

## 2018-07-26 DIAGNOSIS — O133 Gestational [pregnancy-induced] hypertension without significant proteinuria, third trimester: Secondary | ICD-10-CM | POA: Insufficient documentation

## 2018-07-26 LAB — COMPREHENSIVE METABOLIC PANEL
ALT: 19 U/L (ref 0–44)
AST: 21 U/L (ref 15–41)
Albumin: 3 g/dL — ABNORMAL LOW (ref 3.5–5.0)
Alkaline Phosphatase: 70 U/L (ref 38–126)
Anion gap: 7 (ref 5–15)
BUN: 5 mg/dL — ABNORMAL LOW (ref 6–20)
CO2: 22 mmol/L (ref 22–32)
Calcium: 8.9 mg/dL (ref 8.9–10.3)
Chloride: 108 mmol/L (ref 98–111)
Creatinine, Ser: 0.52 mg/dL (ref 0.44–1.00)
GFR calc Af Amer: >60 mL/min (ref >60)
GFR calc non Af Amer: >60 mL/min (ref >60)
Glucose, Bld: 94 mg/dL (ref 70–99)
Potassium: 3.3 mmol/L — ABNORMAL LOW (ref 3.5–5.1)
Sodium: 137 mmol/L (ref 135–145)
Total Bilirubin: 0.6 mg/dL (ref 0.3–1.2)
Total Protein: 6.1 g/dL — ABNORMAL LOW (ref 6.5–8.1)

## 2018-07-26 LAB — CBC
HCT: 33.6 % — ABNORMAL LOW (ref 36.0–46.0)
Hemoglobin: 11.1 g/dL — ABNORMAL LOW (ref 12.0–15.0)
MCH: 24.6 pg — ABNORMAL LOW (ref 26.0–34.0)
MCHC: 33 g/dL (ref 30.0–36.0)
MCV: 74.3 fL — ABNORMAL LOW (ref 80.0–100.0)
Platelets: 256 10*3/uL (ref 150–400)
RBC: 4.52 MIL/uL (ref 3.87–5.11)
RDW: 15.5 % (ref 11.5–15.5)
WBC: 7.5 10*3/uL (ref 4.0–10.5)
nRBC: 0 % (ref 0.0–0.2)

## 2018-07-26 LAB — PROTEIN / CREATININE RATIO, URINE
Creatinine, Urine: 32 mg/dL
Total Protein, Urine: <6 mg/dL

## 2018-07-26 LAB — URIC ACID: Uric Acid, Serum: 4.8 mg/dL (ref 2.5–7.1)

## 2018-07-30 ENCOUNTER — Ambulatory Visit (HOSPITAL_COMMUNITY)
Admission: RE | Admit: 2018-07-30 | Discharge: 2018-07-30 | Disposition: A | Discharge status: Home / Self Care | Payer: BC Managed Care – PPO | Source: Ambulatory Visit | Attending: Family Medicine | Admitting: Family Medicine

## 2018-07-30 ENCOUNTER — Encounter (HOSPITAL_COMMUNITY): Payer: Self-pay

## 2018-07-30 DIAGNOSIS — Z3A36 36 weeks gestation of pregnancy: Secondary | ICD-10-CM | POA: Diagnosis not present

## 2018-07-30 DIAGNOSIS — O133 Gestational [pregnancy-induced] hypertension without significant proteinuria, third trimester: Secondary | ICD-10-CM

## 2018-07-30 DIAGNOSIS — O34219 Maternal care for unspecified type scar from previous cesarean delivery: Secondary | ICD-10-CM | POA: Diagnosis not present

## 2018-07-30 DIAGNOSIS — O24313 Unspecified pre-existing diabetes mellitus in pregnancy, third trimester: Secondary | ICD-10-CM

## 2018-07-30 DIAGNOSIS — O289 Unspecified abnormal findings on antenatal screening of mother: Secondary | ICD-10-CM | POA: Insufficient documentation

## 2018-07-30 DIAGNOSIS — O09213 Supervision of pregnancy with history of pre-term labor, third trimester: Secondary | ICD-10-CM

## 2018-07-30 DIAGNOSIS — Z794 Long term (current) use of insulin: Secondary | ICD-10-CM

## 2018-07-30 DIAGNOSIS — O24414 Gestational diabetes mellitus in pregnancy, insulin controlled: Secondary | ICD-10-CM | POA: Diagnosis not present

## 2018-07-30 DIAGNOSIS — O09523 Supervision of elderly multigravida, third trimester: Secondary | ICD-10-CM

## 2018-07-30 DIAGNOSIS — IMO0001 Reserved for inherently not codable concepts without codable children: Secondary | ICD-10-CM

## 2018-08-02 ENCOUNTER — Encounter (HOSPITAL_COMMUNITY)
Admit: 2018-08-02 | Discharge: 2018-08-02 | Disposition: A | Discharge status: Home / Self Care | Payer: BC Managed Care – PPO | Attending: Obstetrics and Gynecology | Admitting: Obstetrics and Gynecology

## 2018-08-02 HISTORY — DX: Gestational (pregnancy-induced) hypertension without significant proteinuria, unspecified trimester: O13.9

## 2018-08-02 LAB — CBC
HCT: 31.9 % — ABNORMAL LOW (ref 36.0–46.0)
Hemoglobin: 10.2 g/dL — ABNORMAL LOW (ref 12.0–15.0)
MCH: 24.1 pg — ABNORMAL LOW (ref 26.0–34.0)
MCHC: 32 g/dL (ref 30.0–36.0)
MCV: 75.2 fL — ABNORMAL LOW (ref 80.0–100.0)
Platelets: 213 10*3/uL (ref 150–400)
RBC: 4.24 MIL/uL (ref 3.87–5.11)
RDW: 16.2 % — ABNORMAL HIGH (ref 11.5–15.5)
WBC: 6.2 10*3/uL (ref 4.0–10.5)
nRBC: 0 % (ref 0.0–0.2)

## 2018-08-02 LAB — TYPE AND SCREEN
ABO/RH(D): A POS
Antibody Screen: NEGATIVE

## 2018-08-02 NOTE — Patient Instructions (Signed)
[name]  08/02/2018   Your procedure is scheduled on:  08/03/2018  Enter through the Main Entrance of [facility] [place] up the phone at the desk and dial [phone]  Call this number if you have problems the morning of [phone]  Remember:   Do not eat food:(After Midnight) [place] median[place].  Do not drink clear liquids: (After Midnight) [place] median[place].  Take these medicines the morning of surgery with A SIP OF WATER: take 12 units of insulin tonight at bedtime.  Take procardia in the morning as prescribed   Do not wear jewelry, make-up or nail polish.  Do not wear lotions, powders, or perfumes. Do not wear deodorant.  Do not shave 48 hours prior to surgery.  Do not bring valuables to the hospital.  [facility] is not   responsible for any belongings or valuables brought to the hospital.  Contacts, dentures or bridgework may not be worn into surgery.  Leave suitcase in the car. After surgery it may be brought to your room.  For patients admitted to the hospital, checkout time is 11:00 AM the day of              discharge.    N/A   Please read over the following fact sheets that you were given:   Surgical Site Infection Prevention

## 2018-08-03 ENCOUNTER — Inpatient Hospital Stay (HOSPITAL_COMMUNITY): Payer: BC Managed Care – PPO | Admitting: Anesthesiology

## 2018-08-03 ENCOUNTER — Encounter (HOSPITAL_COMMUNITY): Payer: Self-pay | Admitting: *Deleted

## 2018-08-03 ENCOUNTER — Inpatient Hospital Stay (HOSPITAL_COMMUNITY)
Admission: RE | Admit: 2018-08-03 | Discharge: 2018-08-06 | DRG: 785 | Disposition: A | Discharge status: Home / Self Care | Payer: BC Managed Care – PPO | Attending: Obstetrics and Gynecology | Admitting: Obstetrics and Gynecology

## 2018-08-03 ENCOUNTER — Encounter (HOSPITAL_COMMUNITY)
Admission: RE | Disposition: A | Discharge status: Home / Self Care | Payer: Self-pay | Source: Home / Self Care | Attending: Obstetrics and Gynecology

## 2018-08-03 ENCOUNTER — Other Ambulatory Visit: Payer: Self-pay

## 2018-08-03 DIAGNOSIS — Z302 Encounter for sterilization: Secondary | ICD-10-CM

## 2018-08-03 DIAGNOSIS — D509 Iron deficiency anemia, unspecified: Secondary | ICD-10-CM | POA: Diagnosis present

## 2018-08-03 DIAGNOSIS — E876 Hypokalemia: Secondary | ICD-10-CM | POA: Diagnosis present

## 2018-08-03 DIAGNOSIS — O134 Gestational [pregnancy-induced] hypertension without significant proteinuria, complicating childbirth: Secondary | ICD-10-CM | POA: Diagnosis present

## 2018-08-03 DIAGNOSIS — O99824 Streptococcus B carrier state complicating childbirth: Secondary | ICD-10-CM | POA: Diagnosis present

## 2018-08-03 DIAGNOSIS — Z98891 History of uterine scar from previous surgery: Secondary | ICD-10-CM

## 2018-08-03 DIAGNOSIS — O24424 Gestational diabetes mellitus in childbirth, insulin controlled: Secondary | ICD-10-CM | POA: Diagnosis present

## 2018-08-03 DIAGNOSIS — O99214 Obesity complicating childbirth: Secondary | ICD-10-CM | POA: Diagnosis present

## 2018-08-03 DIAGNOSIS — O9902 Anemia complicating childbirth: Secondary | ICD-10-CM | POA: Diagnosis present

## 2018-08-03 DIAGNOSIS — Z3A37 37 weeks gestation of pregnancy: Secondary | ICD-10-CM

## 2018-08-03 DIAGNOSIS — O34211 Maternal care for low transverse scar from previous cesarean delivery: Principal | ICD-10-CM | POA: Diagnosis present

## 2018-08-03 LAB — COMPREHENSIVE METABOLIC PANEL
ALT: 18 U/L (ref 0–44)
AST: 21 U/L (ref 15–41)
Albumin: 2.9 g/dL — ABNORMAL LOW (ref 3.5–5.0)
Alkaline Phosphatase: 74 U/L (ref 38–126)
Anion gap: 6 (ref 5–15)
BUN: 9 mg/dL (ref 6–20)
CO2: 18 mmol/L — ABNORMAL LOW (ref 22–32)
Calcium: 8.3 mg/dL — ABNORMAL LOW (ref 8.9–10.3)
Chloride: 111 mmol/L (ref 98–111)
Creatinine, Ser: 0.51 mg/dL (ref 0.44–1.00)
GFR calc Af Amer: >60 mL/min (ref >60)
GFR calc non Af Amer: >60 mL/min (ref >60)
Glucose, Bld: 100 mg/dL — ABNORMAL HIGH (ref 70–99)
Potassium: 3.3 mmol/L — ABNORMAL LOW (ref 3.5–5.1)
Sodium: 135 mmol/L (ref 135–145)
Total Bilirubin: 0.7 mg/dL (ref 0.3–1.2)
Total Protein: 6.1 g/dL — ABNORMAL LOW (ref 6.5–8.1)

## 2018-08-03 LAB — PROTEIN / CREATININE RATIO, URINE
Creatinine, Urine: 83 mg/dL
Protein Creatinine Ratio: 0.66 mg/mg{Cre} — ABNORMAL HIGH (ref 0.00–0.15)
Total Protein, Urine: 55 mg/dL

## 2018-08-03 LAB — URIC ACID: Uric Acid, Serum: 5.9 mg/dL (ref 2.5–7.1)

## 2018-08-03 LAB — GLUCOSE, CAPILLARY
Glucose-Capillary: 96 mg/dL (ref 70–99)
Glucose-Capillary: 157 mg/dL — ABNORMAL HIGH (ref 70–99)
Glucose-Capillary: 158 mg/dL — ABNORMAL HIGH (ref 70–99)

## 2018-08-03 LAB — RPR: RPR Ser Ql: NONREACTIVE

## 2018-08-03 LAB — MAGNESIUM: Magnesium: 3.5 mg/dL — ABNORMAL HIGH (ref 1.7–2.4)

## 2018-08-03 SURGERY — Surgical Case
Anesthesia: Spinal | Wound class: Clean Contaminated

## 2018-08-03 MED ORDER — BUPIVACAINE HCL (PF) 0.25 % IJ SOLN
INTRAMUSCULAR | Status: DC | PRN
  Administered 2018-08-03: 10 mL

## 2018-08-03 MED ORDER — POTASSIUM CHLORIDE CRYS ER 20 MEQ PO TBCR
40.0000 meq | EXTENDED_RELEASE_TABLET | Freq: Two times a day (BID) | ORAL | Status: AC
  Administered 2018-08-03 – 2018-08-05 (×6): 40 meq via ORAL
  Filled 2018-08-03 (×6): qty 2

## 2018-08-03 MED ORDER — MAGNESIUM SULFATE 40 G IN LACTATED RINGERS - SIMPLE
2.0000 g/h | INTRAVENOUS | Status: AC
  Filled 2018-08-03 (×2): qty 500

## 2018-08-03 MED ORDER — OXYTOCIN 40 UNITS IN LACTATED RINGERS INFUSION - SIMPLE MED: 2.5000 [IU]/h | INTRAVENOUS | Status: DC

## 2018-08-03 MED ORDER — SCOPOLAMINE 1 MG/3DAYS TD PT72: 1.0000 | MEDICATED_PATCH | Freq: Once | TRANSDERMAL | Status: DC

## 2018-08-03 MED ORDER — PHENYLEPHRINE 8 MG IN D5W 100 ML (0.08MG/ML) PREMIX OPTIME
INJECTION | INTRAVENOUS | Status: AC
  Filled 2018-08-03: qty 100

## 2018-08-03 MED ORDER — DIBUCAINE 1 % RE OINT
1.0000 "application " | TOPICAL_OINTMENT | RECTAL | Status: DC | PRN
  Administered 2018-08-05: 1 via RECTAL
  Filled 2018-08-03: qty 28

## 2018-08-03 MED ORDER — ZOLPIDEM TARTRATE 5 MG PO TABS: 5.0000 mg | ORAL_TABLET | Freq: Every evening | ORAL | Status: DC | PRN

## 2018-08-03 MED ORDER — KETOROLAC TROMETHAMINE 30 MG/ML IJ SOLN
30.0000 mg | Freq: Once | INTRAMUSCULAR | Status: AC
  Administered 2018-08-03: 30 mg via INTRAVENOUS

## 2018-08-03 MED ORDER — FENTANYL CITRATE (PF) 100 MCG/2ML IJ SOLN
INTRAMUSCULAR | Status: AC
  Filled 2018-08-03: qty 2

## 2018-08-03 MED ORDER — NALBUPHINE HCL 10 MG/ML IJ SOLN: 5.0000 mg | Freq: Once | INTRAMUSCULAR | Status: DC | PRN

## 2018-08-03 MED ORDER — SIMETHICONE 80 MG PO CHEW
80.0000 mg | CHEWABLE_TABLET | Freq: Three times a day (TID) | ORAL | Status: DC
  Administered 2018-08-03 – 2018-08-06 (×9): 80 mg via ORAL
  Filled 2018-08-03 (×9): qty 1

## 2018-08-03 MED ORDER — SIMETHICONE 80 MG PO CHEW: 80.0000 mg | CHEWABLE_TABLET | ORAL | Status: DC | PRN

## 2018-08-03 MED ORDER — PHENYLEPHRINE 40 MCG/ML (10ML) SYRINGE FOR IV PUSH (FOR BLOOD PRESSURE SUPPORT)
PREFILLED_SYRINGE | INTRAVENOUS | Status: AC
  Filled 2018-08-03: qty 10

## 2018-08-03 MED ORDER — NALBUPHINE HCL 10 MG/ML IJ SOLN: 5.0000 mg | INTRAMUSCULAR | Status: DC | PRN

## 2018-08-03 MED ORDER — CEFAZOLIN SODIUM-DEXTROSE 2-4 GM/100ML-% IV SOLN
2.0000 g | INTRAVENOUS | Status: AC
  Administered 2018-08-03: 2 g via INTRAVENOUS
  Filled 2018-08-03: qty 100

## 2018-08-03 MED ORDER — ACETAMINOPHEN 10 MG/ML IV SOLN
INTRAVENOUS | Status: AC
  Administered 2018-08-03: 1000 mg via INTRAVENOUS
  Filled 2018-08-03: qty 100

## 2018-08-03 MED ORDER — ACETAMINOPHEN 10 MG/ML IV SOLN
1000.0000 mg | Freq: Once | INTRAVENOUS | Status: DC | PRN
  Administered 2018-08-03: 1000 mg via INTRAVENOUS

## 2018-08-03 MED ORDER — ONDANSETRON HCL 4 MG/2ML IJ SOLN
INTRAMUSCULAR | Status: DC | PRN
  Administered 2018-08-03: 4 mg via INTRAVENOUS

## 2018-08-03 MED ORDER — DIPHENHYDRAMINE HCL 50 MG/ML IJ SOLN: 12.5000 mg | INTRAMUSCULAR | Status: DC | PRN

## 2018-08-03 MED ORDER — METOCLOPRAMIDE HCL 5 MG/ML IJ SOLN
INTRAMUSCULAR | Status: DC | PRN
  Administered 2018-08-03 (×2): 5 mg via INTRAVENOUS

## 2018-08-03 MED ORDER — SENNOSIDES-DOCUSATE SODIUM 8.6-50 MG PO TABS
2.0000 | ORAL_TABLET | ORAL | Status: DC
  Administered 2018-08-03 – 2018-08-05 (×3): 2 via ORAL
  Filled 2018-08-03 (×3): qty 2

## 2018-08-03 MED ORDER — LACTATED RINGERS IV SOLN
INTRAVENOUS | Status: DC
  Administered 2018-08-03 – 2018-08-04 (×2): via INTRAVENOUS

## 2018-08-03 MED ORDER — PRENATAL MULTIVITAMIN CH
1.0000 | ORAL_TABLET | Freq: Every day | ORAL | Status: DC
  Administered 2018-08-04 – 2018-08-06 (×3): 1 via ORAL
  Filled 2018-08-03 (×4): qty 1

## 2018-08-03 MED ORDER — HYDRALAZINE HCL 20 MG/ML IJ SOLN: 10.0000 mg | INTRAMUSCULAR | Status: DC | PRN

## 2018-08-03 MED ORDER — PHENYLEPHRINE HCL 10 MG/ML IJ SOLN
INTRAMUSCULAR | Status: DC | PRN
  Administered 2018-08-03: 160 ug via INTRAVENOUS

## 2018-08-03 MED ORDER — FENTANYL CITRATE (PF) 100 MCG/2ML IJ SOLN
INTRAMUSCULAR | Status: DC | PRN
  Administered 2018-08-03: 50 ug via INTRAVENOUS
  Administered 2018-08-03: 35 ug via INTRAVENOUS
  Administered 2018-08-03: 15 ug via INTRATHECAL

## 2018-08-03 MED ORDER — NALOXONE HCL 0.4 MG/ML IJ SOLN: 0.4000 mg | INTRAMUSCULAR | Status: DC | PRN

## 2018-08-03 MED ORDER — OXYTOCIN 10 UNIT/ML IJ SOLN
INTRAMUSCULAR | Status: AC
  Filled 2018-08-03: qty 4

## 2018-08-03 MED ORDER — KETOROLAC TROMETHAMINE 30 MG/ML IJ SOLN
INTRAMUSCULAR | Status: AC
  Filled 2018-08-03: qty 1

## 2018-08-03 MED ORDER — IBUPROFEN 800 MG PO TABS
800.0000 mg | ORAL_TABLET | Freq: Three times a day (TID) | ORAL | Status: AC
  Administered 2018-08-03 – 2018-08-06 (×8): 800 mg via ORAL
  Filled 2018-08-03 (×8): qty 1

## 2018-08-03 MED ORDER — ONDANSETRON HCL 4 MG/2ML IJ SOLN: 4.0000 mg | Freq: Three times a day (TID) | INTRAMUSCULAR | Status: DC | PRN

## 2018-08-03 MED ORDER — MORPHINE SULFATE (PF) 0.5 MG/ML IJ SOLN
INTRAMUSCULAR | Status: DC | PRN
  Administered 2018-08-03: .15 mg via INTRATHECAL

## 2018-08-03 MED ORDER — COCONUT OIL OIL: 1.0000 "application " | TOPICAL_OIL | Status: DC | PRN

## 2018-08-03 MED ORDER — PROPOFOL 10 MG/ML IV BOLUS
INTRAVENOUS | Status: DC | PRN
  Administered 2018-08-03 (×2): 20 mg via INTRAVENOUS

## 2018-08-03 MED ORDER — LABETALOL HCL 5 MG/ML IV SOLN: 80.0000 mg | INTRAVENOUS | Status: DC | PRN

## 2018-08-03 MED ORDER — PROPOFOL 10 MG/ML IV BOLUS
INTRAVENOUS | Status: AC
  Filled 2018-08-03: qty 20

## 2018-08-03 MED ORDER — BUPIVACAINE IN DEXTROSE 0.75-8.25 % IT SOLN
INTRATHECAL | Status: DC | PRN
  Administered 2018-08-03: 1.8 mg via INTRATHECAL

## 2018-08-03 MED ORDER — HYDROMORPHONE HCL 2 MG PO TABS
4.0000 mg | ORAL_TABLET | ORAL | Status: DC | PRN
  Administered 2018-08-04 – 2018-08-05 (×4): 4 mg via ORAL
  Filled 2018-08-03 (×4): qty 2

## 2018-08-03 MED ORDER — DIPHENHYDRAMINE HCL 25 MG PO CAPS: 25.0000 mg | ORAL_CAPSULE | Freq: Four times a day (QID) | ORAL | Status: DC | PRN

## 2018-08-03 MED ORDER — NIFEDIPINE ER OSMOTIC RELEASE 30 MG PO TB24
60.0000 mg | ORAL_TABLET | Freq: Every day | ORAL | Status: DC
  Administered 2018-08-03 – 2018-08-06 (×4): 60 mg via ORAL
  Filled 2018-08-03 (×4): qty 2

## 2018-08-03 MED ORDER — SODIUM CHLORIDE 0.9% FLUSH: 3.0000 mL | INTRAVENOUS | Status: DC | PRN

## 2018-08-03 MED ORDER — DIPHENHYDRAMINE HCL 25 MG PO CAPS
25.0000 mg | ORAL_CAPSULE | ORAL | Status: DC | PRN
  Filled 2018-08-03: qty 1

## 2018-08-03 MED ORDER — OXYTOCIN 10 UNIT/ML IJ SOLN
INTRAVENOUS | Status: DC | PRN
  Administered 2018-08-03: 40 [IU] via INTRAVENOUS

## 2018-08-03 MED ORDER — MENTHOL 3 MG MT LOZG: 1.0000 | LOZENGE | OROMUCOSAL | Status: DC | PRN

## 2018-08-03 MED ORDER — BUPIVACAINE HCL (PF) 0.25 % IJ SOLN
INTRAMUSCULAR | Status: AC
  Filled 2018-08-03: qty 20

## 2018-08-03 MED ORDER — METOCLOPRAMIDE HCL 5 MG/ML IJ SOLN
INTRAMUSCULAR | Status: AC
  Filled 2018-08-03: qty 2

## 2018-08-03 MED ORDER — LABETALOL HCL 5 MG/ML IV SOLN: 20.0000 mg | INTRAVENOUS | Status: DC | PRN

## 2018-08-03 MED ORDER — PHENYLEPHRINE 8 MG IN D5W 100 ML (0.08MG/ML) PREMIX OPTIME
INJECTION | INTRAVENOUS | Status: DC | PRN
  Administered 2018-08-03: 60 ug/min via INTRAVENOUS

## 2018-08-03 MED ORDER — WITCH HAZEL-GLYCERIN EX PADS
1.0000 "application " | MEDICATED_PAD | CUTANEOUS | Status: DC | PRN
  Administered 2018-08-05: 1 via TOPICAL

## 2018-08-03 MED ORDER — SIMETHICONE 80 MG PO CHEW
80.0000 mg | CHEWABLE_TABLET | ORAL | Status: DC
  Administered 2018-08-03 – 2018-08-05 (×3): 80 mg via ORAL
  Filled 2018-08-03 (×3): qty 1

## 2018-08-03 MED ORDER — LACTATED RINGERS IV SOLN: INTRAVENOUS | Status: DC

## 2018-08-03 MED ORDER — LABETALOL HCL 5 MG/ML IV SOLN: 40.0000 mg | INTRAVENOUS | Status: DC | PRN

## 2018-08-03 MED ORDER — LACTATED RINGERS IV SOLN
INTRAVENOUS | Status: DC
  Administered 2018-08-03 (×3): via INTRAVENOUS

## 2018-08-03 MED ORDER — MORPHINE SULFATE-NACL 0.5-0.9 MG/ML-% IV SOSY
PREFILLED_SYRINGE | INTRAVENOUS | Status: DC | PRN
  Administered 2018-08-03: 2 mg via INTRAVENOUS
  Administered 2018-08-03: 4 mg via INTRAVENOUS
  Administered 2018-08-03 (×2): 2 mg via EPIDURAL

## 2018-08-03 MED ORDER — MORPHINE SULFATE (PF) 0.5 MG/ML IJ SOLN
INTRAMUSCULAR | Status: AC
  Filled 2018-08-03: qty 10

## 2018-08-03 MED ORDER — MAGNESIUM SULFATE BOLUS VIA INFUSION
4.0000 g | Freq: Once | INTRAVENOUS | Status: AC
  Administered 2018-08-03: 4 g via INTRAVENOUS
  Filled 2018-08-03: qty 500

## 2018-08-03 MED ORDER — NALOXONE HCL 4 MG/10ML IJ SOLN
1.0000 ug/kg/h | INTRAVENOUS | Status: DC | PRN
  Filled 2018-08-03: qty 5

## 2018-08-03 MED ORDER — ONDANSETRON HCL 4 MG/2ML IJ SOLN
INTRAMUSCULAR | Status: AC
  Filled 2018-08-03: qty 2

## 2018-08-03 SURGICAL SUPPLY — 46 items
BARRIER ADHS 3X4 INTERCEED (GAUZE/BANDAGES/DRESSINGS) ×2 IMPLANT
BENZOIN TINCTURE PRP APPL 2/3 (GAUZE/BANDAGES/DRESSINGS) ×2 IMPLANT
CHLORAPREP W/TINT 26ML (MISCELLANEOUS) ×2 IMPLANT
CLAMP CORD UMBIL (MISCELLANEOUS) IMPLANT
CLOTH BEACON ORANGE TIMEOUT ST (SAFETY) ×2 IMPLANT
DRAPE C SECTION CLR SCREEN (DRAPES) ×2 IMPLANT
DRSG OPSITE POSTOP 4X10 (GAUZE/BANDAGES/DRESSINGS) ×2 IMPLANT
ELECT REM PT RETURN 9FT ADLT (ELECTROSURGICAL) ×2
ELECTRODE REM PT RTRN 9FT ADLT (ELECTROSURGICAL) ×1 IMPLANT
EXTRACTOR VACUUM M CUP 4 TUBE (SUCTIONS) IMPLANT
GLOVE BIOGEL PI IND STRL 7.0 (GLOVE) ×2 IMPLANT
GLOVE BIOGEL PI INDICATOR 7.0 (GLOVE) ×2
GLOVE ECLIPSE 6.5 STRL STRAW (GLOVE) ×2 IMPLANT
GOWN STRL REUS W/TWL LRG LVL3 (GOWN DISPOSABLE) ×4 IMPLANT
KIT ABG SYR 3ML LUER SLIP (SYRINGE) IMPLANT
NEEDLE HYPO 22GX1.5 SAFETY (NEEDLE) ×2 IMPLANT
NEEDLE HYPO 25X5/8 SAFETYGLIDE (NEEDLE) IMPLANT
NS IRRIG 1000ML POUR BTL (IV SOLUTION) ×2 IMPLANT
PACK C SECTION WH (CUSTOM PROCEDURE TRAY) ×2 IMPLANT
PAD OB MATERNITY 4.3X12.25 (PERSONAL CARE ITEMS) ×2 IMPLANT
RTRCTR C-SECT PINK 25CM LRG (MISCELLANEOUS) IMPLANT
SPONGE LAP 18X18 RF (DISPOSABLE) ×6 IMPLANT
SPONGE LAP 18X18 X RAY DECT (DISPOSABLE) ×2 IMPLANT
STRIP CLOSURE SKIN 1/2X4 (GAUZE/BANDAGES/DRESSINGS) ×2 IMPLANT
SUT CHROMIC 0 CTX 36 (SUTURE) ×8 IMPLANT
SUT CHROMIC GUT AB #0 18 (SUTURE) IMPLANT
SUT MNCRL 0 VIOLET CTX 36 (SUTURE) ×3 IMPLANT
SUT MON AB 2-0 SH 27 (SUTURE)
SUT MON AB 2-0 SH27 (SUTURE) IMPLANT
SUT MON AB 3-0 SH 27 (SUTURE)
SUT MON AB 3-0 SH27 (SUTURE) IMPLANT
SUT MON AB 4-0 PS1 27 (SUTURE) IMPLANT
SUT MONOCRYL 0 CTX 36 (SUTURE) ×3
SUT PLAIN 2 0 (SUTURE)
SUT PLAIN 2 0 XLH (SUTURE) ×2 IMPLANT
SUT PLAIN ABS 2-0 CT1 27XMFL (SUTURE) IMPLANT
SUT VIC AB 0 CT1 36 (SUTURE) ×4 IMPLANT
SUT VIC AB 2-0 CT1 27 (SUTURE) ×1
SUT VIC AB 2-0 CT1 TAPERPNT 27 (SUTURE) ×1 IMPLANT
SUT VIC AB 3-0 SH 27 (SUTURE) ×1
SUT VIC AB 3-0 SH 27X BRD (SUTURE) ×1 IMPLANT
SUT VIC AB 4-0 PS2 27 (SUTURE) IMPLANT
SYR CONTROL 10ML LL (SYRINGE) ×2 IMPLANT
TOWEL OR 17X24 6PK STRL BLUE (TOWEL DISPOSABLE) ×2 IMPLANT
TRAY FOLEY W/BAG SLVR 14FR LF (SET/KITS/TRAYS/PACK) IMPLANT
VACUUM CUP M-STYLE MYSTIC II (SUCTIONS) ×2 IMPLANT

## 2018-08-03 NOTE — Progress Notes (Signed)
BP (!) 148/93 (BP Location: Left Arm)   Pulse 79   Temp 98.4 F (36.9 C) (Oral)   Resp 12   Ht 5\' 2"  (1.575 m)   Wt 100.7 kg   LMP 11/16/2017   SpO2 98%   BMI 40.59 kg/m  CBC    Component Value Date/Time   WBC 6.2 08/02/2018 1035   RBC 4.24 08/02/2018 1035   HGB 10.2 (L) 08/02/2018 1035   HGB 12.1 09/27/2017 1552   HCT 31.9 (L) 08/02/2018 1035   HCT 35.6 09/27/2017 1552   PLT 213 08/02/2018 1035   PLT 351 09/27/2017 1552   MCV 75.2 (L) 08/02/2018 1035   MCV 77 (L) 09/27/2017 1552   MCH 24.1 (L) 08/02/2018 1035   MCHC 32.0 08/02/2018 1035   RDW 16.2 (H) 08/02/2018 1035   RDW 14.3 09/27/2017 1552   CMP Latest Ref Rng & Units 08/03/2018 07/26/2018 07/18/2018  Glucose 70 - 99 mg/dL 100(H) 94 117(H)  BUN 6 - 20 mg/dL 9 5(L) 7  Creatinine 0.44 - 1.00 mg/dL 0.51 0.52 0.49  Sodium 135 - 145 mmol/L 135 137 136  Potassium 3.5 - 5.1 mmol/L 3.3(L) 3.3(L) 3.3(L)  Chloride 98 - 111 mmol/L 111 108 109  CO2 22 - 32 mmol/L 18(L) 22 18(L)  Calcium 8.9 - 10.3 mg/dL 8.3(L) 8.9 8.6(L)  Total Protein 6.5 - 8.1 g/dL 6.1(L) 6.1(L) 5.7(L)  Total Bilirubin 0.3 - 1.2 mg/dL 0.7 0.6 0.5  Alkaline Phos 38 - 126 U/L 74 70 64  AST 15 - 41 U/L 21 21 23   ALT 0 - 44 U/L 18 19 13   urine protein/creatinine 0.66 With the elevated BP and increased urine protein./creatinine ratio, will proceed with magnesium sulfate Hypokalemia:  Potassium supplement

## 2018-08-03 NOTE — Anesthesia Postprocedure Evaluation (Signed)
Anesthesia Post Note  Patient: [name]  Procedure(s) Performed: Repeat CESAREAN SECTION (N/A )     Patient location during evaluation: PACU Anesthesia Type: Spinal Level of consciousness: oriented and awake and alert Pain management: pain level controlled Vital Signs Assessment: post-procedure vital signs reviewed and stable Respiratory status: spontaneous breathing, respiratory function stable and patient connected to nasal cannula oxygen Cardiovascular status: blood pressure returned to baseline and stable Postop Assessment: no headache, no backache and no apparent nausea or vomiting Anesthetic complications: no    Last Vitals:  Vitals:   08/03/18 1703 08/03/18 1818  BP: 127/77 (!) 139/92  Pulse: 87 82  Resp: 16 16  Temp:    SpO2: 95% 99%    Last Pain:  Vitals:   08/03/18 1615  TempSrc:   PainSc: 3    Pain Goal: Patients Stated Pain Goal: 4 (08/03/18 1210)               [name]

## 2018-08-03 NOTE — H&P (Signed)
[name] is a 42 y.o. female presenting for repeat C/s, TL due to previous C/S x 2, desires sterilization. PNC complicated by Class A2 GDm, unexplained elev MSAFP, GBS cx (+). Gest HTN, AMA OB History    Gravida  6   Para  2   Term  1   Preterm  1   AB  3   Living  2     SAB  3   TAB      Ectopic      Multiple  0   Live Births  1          Past Medical History:  Diagnosis Date  . Diabetes mellitus without complication (HCC)    gestational  . Gestational diabetes   . Postpartum care following cesarean delivery Indication: Repeat, SROM (12/11) 07/17/2016  . Pregnancy induced hypertension    Past Surgical History:  Procedure Laterality Date  . BREAST LUMPECTOMY    . BREAST SURGERY    . CESAREAN SECTION    . CESAREAN SECTION N/A 07/17/2016   Procedure: CESAREAN SECTION;  Surgeon: Servando Salina, MD;  Location: [place];  Service: Obstetrics;  Laterality: N/A;   Family History: family history includes Diabetes in her brother, mother, and sister; Heart disease in her brother; Hypertension in her mother. Social History:  reports that she has never smoked. She has never used smokeless tobacco. She reports previous alcohol use. She reports that she does not use drugs.     Maternal Diabetes: Yes:  Diabetes Type:  Insulin/Medication controlled Genetic Screening: Abnormal:  Results: Elevated AFP Maternal Ultrasounds/Referrals: Normal Fetal Ultrasounds or other Referrals:  Fetal echo Maternal Substance Abuse:  No Significant Maternal Medications:  Meds include: Other: insulin Significant Maternal Lab Results:  Lab values include: Group B Strep positive Other Comments:  previous C/S x 2. nl NIPT, elev MSAFP, AMA, gest HTN. bmz complete  Review of Systems  Eyes: Negative for blurred vision.  Gastrointestinal: Negative for heartburn.  Neurological: Negative for headaches.  All other systems reviewed and are negative.  History   Blood pressure (!)  159/80, pulse 73, temperature 98.3 F (36.8 C), temperature source Oral, resp. rate 18, height 5\' 2"  (1.575 m), weight 100.7 kg, last menstrual period 11/16/2017, unknown if currently breastfeeding. Exam Physical Exam  Constitutional: She is oriented to person, place, and time. She appears well-nourished.  Neck: Neck supple.  Cardiovascular: Regular rhythm.  Respiratory: Breath sounds normal.  GI: Soft.  Musculoskeletal:        General: Edema present.  Neurological: She is alert and oriented to person, place, and time.  Skin: Skin is warm and dry.  Psychiatric: She has a normal mood and affect.    Prenatal labs: ABO, Rh: --/--/A POS (12/27 1035) Antibody: NEG (12/27 1035) Rubella: Immune (06/28 0000) RPR: Non Reactive (12/27 1035)  HBsAg: Negative (06/28 0000)  HIV: Non-reactive (06/28 0000)  GBS: Positive (12/04 0000)   Assessment/Plan: Gestational HTN Previous C/S x 2 Desires sterilization AMA,  IUP @ 37 1/7 weeks Unexplained elevated MSAFP P) repeat C/S, TL. Risk of surgery reviewed including infection, bleeding, poss need for blood transfusion and its risk, injury to bladder, bowel, ureter, internal scar tissue, permanent sterilization, non reversible, failure rate 1/300. Routine labs   [name] A [name] 08/03/2018, 9:21 AM

## 2018-08-03 NOTE — Op Note (Signed)
[name], [name] MEDICAL RECORD [id number] ACCOUNT [id number] DATE OF BIRTH:November 19, 1975 FACILITY: [facility] LOCATION: [id number] PHYSICIAN:Jourdin Gens A. Joanthan Hlavacek, MD  OPERATIVE REPORT  DATE OF PROCEDURE:  08/03/2018  PREOPERATIVE DIAGNOSES:  Gestational hypertension, class A2 gestational diabetes, previous cesarean section x2, intrauterine gestation at 37-1/7 weeks.  PROCEDURE:  Repeat cesarean section, Buddy Duty hysterotomy, modified Pomeroy tubal ligation.   POSTOPERATIVE DIAGNOSES:  Gestational hypertension, class A2 gestational diabetes, previous cesarean section x2, intrauterine gestation at 37-1/7 weeks,  desires sterilization.  ANESTHESIA:  Spinal.  SURGEON:  Servando Salina, MD  ASSISTANT:  Artelia Laroche, CNM  DESCRIPTION OF PROCEDURE:  Under adequate spinal anesthesia, the patient was placed in the supine position with a left lateral tilt.  She was sterilely prepped and draped in the usual fashion.  Indwelling Foley catheter was sterilely placed.  Marcaine 0.25% was injected along the previous Pfannenstiel skin incision site.  Pfannenstiel skin incision was then made, carried down to the rectus fascia.  The rectus fascia was opened transversely.  The rectus fascia was then bluntly and sharply dissected off  the rectus muscle in a superior and inferior fashion.  The rectus muscle was split in the midline.  The parietal peritoneum was opened sharply and extended.  A self-retaining Alexis retractor was then placed.  Prominent vessels on the lower uterine segment was noted.  Nonetheless, the bladder retractor was then placed, and the vesicouterine peritoneum was opened and extended transversely.  A curvilinear low transverse incision was then made and opened bluntly in a cephalic and caudad manner.   Artificial rupture of membranes occurred.  A floating vertex was encountered.  Attempt at delivery was unsuccessful.  Therefore, a vacuum was used to help with extraction of the head.  Once  this was done, the baby had delayed clamping of the cord for a minute.  The cord was then subsequently clamped, cut, and the baby was transferred to the waiting pediatrician who assigned Apgars of 8 at one minute and 7 at five minutes.  The uterus was massaged.  The placenta was manually removed.  The uterine cavity  was cleaned of debris.  The uterine incision had no extension.  The incision was closed with 2 layers, the first layer 0 Monocryl in running lock stitch, second layer was imbricating using 0 Monocryl suture.  Bleeding from the left angle was noted.  Figure-of-eight suture was placed.  Additional bleeding along the incision line was then noted, and additional sutures were then placed across the entire incision resulting in good hemostasis being achieved.  The bladder flap was further developed and displaced inferiorly in the interim.  The abdomen was irrigated and suctioned of debris.  Attention was then turned to the fallopian tubes.  On the left, the fallopian tube was identified down to its fimbriated end.  A Kary Kos was then used to grab the midportion.  Underlying mesosalpinx was opened with cautery.  The proximal and distal portion was tied with 0 chromic suture x2 proximally and distally, and the intervening segment of tube was removed.  The same procedure was performed on the  contralateral side with removal of the right fallopian tube.  Once that was done, the Alexis retractor was removed.  Interceed in an inverted T fashion was placed on the lower uterine segment.  Omentum adherent to the left anterior abdominal wall was limiting the ability to close the parietal peritoneum, and this was lysed off the anterior abdominal wall.  Once this was done, the parietal peritoneum was closed with 2-0  Vicryl.  The rectus fascia was closed with 0 Vicryl x2.  The subcutaneous area was irrigated, small bleeders cauterized.  Interrupted 2-0 plain sutures placed and the skin approximated using 4-0 Vicryl  subcuticular closure.  Steri-Strips and benzoin were placed.  SPECIMEN:  Portion of right and left fallopian tubes sent to pathology.  Placenta was not sent.  ESTIMATED BLOOD LOSS:  415 mL.  INTRAOPERATIVE FLUIDS:  2500 mL.  URINE OUTPUT:  200 mL clear yellow urine.  COUNTS:  Sponge and instrument counts x2 was correct.  COMPLICATIONS:  None.  The patient tolerated the procedure well and was transferred to recovery room in stable condition.  LN/NUANCE  D:08/03/2018 T:08/03/2018 JOB:[id number]

## 2018-08-03 NOTE — Brief Op Note (Signed)
08/03/2018  11:04 AM  PATIENT:  [name]  42 y.o. female  PRE-OPERATIVE DIAGNOSIS:  Previous Cesarean Section x 2, Class A2 Gestational Diabetes, GESTATIONAL HYPERTENSION; desires sterilization  POST-OPERATIVE DIAGNOSIS:  Previous Cesarean Section x 2, Class A2 Gestational Diabetes, GESTATIONAL HYPERTENSION; desires sterilization  PROCEDURE:  Repeat Cesarean section, kerr hysterotomy, Modified pomeroy tubal ligation  SURGEON:  Surgeon(s) and Role:    * Servando Salina, MD - Primary  PHYSICIAN ASSISTANT:   ASSISTANTS: Artelia Laroche, CNM   ANESTHESIA:   spinal FINDINGS; live female. Nl tubes and ovaries,  Omental adhesions to anterior lateral wall EBL:  415 mL   BLOOD ADMINISTERED:none  DRAINS: none   LOCAL MEDICATIONS USED:  MARCAINE     SPECIMEN:  Source of Specimen:  portion of right and left fallopian tube  DISPOSITION OF SPECIMEN:  PATHOLOGY  COUNTS:  YES  TOURNIQUET:  * No tourniquets in log *  DICTATION: .Other Dictation: Dictation Number [id number]  PLAN OF CARE: Admit to inpatient   PATIENT DISPOSITION:  PACU - hemodynamically stable.   Delay start of Pharmacological VTE agent (>24hrs) due to surgical blood loss or risk of bleeding: no

## 2018-08-03 NOTE — Progress Notes (Signed)
Dopplered FHR [id number] Itati Brocksmith Lessie Dings, [place] 08/03/2018

## 2018-08-03 NOTE — Anesthesia Preprocedure Evaluation (Addendum)
Anesthesia Evaluation  Patient identified by MRN, date of birth, ID band Patient awake    Reviewed: Allergy & Precautions, NPO status , Patient's Chart, lab work & pertinent test results  Airway Mallampati: II  TM Distance: >3 FB Neck ROM: Full    Dental no notable dental hx. (+) Teeth Intact, Dental Advisory Given   Pulmonary neg pulmonary ROS,    Pulmonary exam normal breath sounds clear to auscultation       Cardiovascular hypertension (gestational HTN), Normal cardiovascular exam Rhythm:Regular Rate:Normal     Neuro/Psych negative neurological ROS  negative psych ROS   GI/Hepatic negative GI ROS, Neg liver ROS,   Endo/Other  diabetes, Gestational, Insulin DependentMorbid obesity  Renal/GU negative Renal ROS  negative genitourinary   Musculoskeletal negative musculoskeletal ROS (+)   Abdominal   Peds  Hematology  (+) Blood dyscrasia, anemia ,   Anesthesia Other Findings Repeat C/S x2  Reproductive/Obstetrics (+) Pregnancy                            Anesthesia Physical Anesthesia Plan  ASA: III  Anesthesia Plan: Spinal   Post-op Pain Management:    Induction:   PONV Risk Score and Plan: Treatment may vary due to age or medical condition  Airway Management Planned: Natural Airway  Additional Equipment:   Intra-op Plan:   Post-operative Plan:   Informed Consent: I have reviewed the patients History and Physical, chart, labs and discussed the procedure including the risks, benefits and alternatives for the proposed anesthesia with the patient or authorized representative who has indicated his/her understanding and acceptance.   Dental advisory given  Plan Discussed with: CRNA  Anesthesia Plan Comments:         Anesthesia Quick Evaluation

## 2018-08-03 NOTE — Progress Notes (Signed)
Bedside report given to Jinny Blossom, RN and Janett Billow, RN  in [place] for transfer to 3rd floor. Vital signs stable.

## 2018-08-03 NOTE — Transfer of Care (Signed)
Immediate Anesthesia Transfer of Care Note  Patient: [name]  Procedure(s) Performed: Repeat CESAREAN SECTION (N/A )  Patient Location: PACU  Anesthesia Type:Spinal  Level of Consciousness: sedated  Airway & Oxygen Therapy: Patient Spontanous Breathing  Post-op Assessment: Report given to RN  Post vital signs: Reviewed and stable  Last Vitals:  Vitals Value Taken Time  BP    Temp    Pulse 72 08/03/2018 11:19 AM  Resp    SpO2 99 % 08/03/2018 11:19 AM  Vitals shown include unvalidated device data.  Last Pain:  Vitals:   08/03/18 0901  TempSrc:   PainSc: 0-No pain         Complications: No apparent anesthesia complications

## 2018-08-03 NOTE — Anesthesia Procedure Notes (Signed)
Spinal  Patient location during procedure: OR Start time: 08/03/2018 9:30 AM End time: 08/03/2018 9:40 AM Staffing Anesthesiologist: Freddrick March, MD Performed: anesthesiologist  Preanesthetic Checklist Completed: patient identified, surgical consent, pre-op evaluation, timeout performed, IV checked, risks and benefits discussed and monitors and equipment checked Spinal Block Patient position: sitting Prep: site prepped and draped and DuraPrep Patient monitoring: heart rate, continuous pulse ox and blood pressure Approach: midline Location: L3-4 Injection technique: single-shot Needle Needle type: Pencan  Needle gauge: 24 G Needle length: 9 cm Assessment Sensory level: T6 Additional Notes Discussed risks and benefits of and difference between general anesthesia and spinal anesthesia. Discussed risks of spinal including headache, backache, failure, bleeding, infection, and nerve damage. Patient consents to spinal. Questions answered. Coagulation studies and platelet count acceptable.

## 2018-08-04 LAB — CBC
HCT: 29.9 % — ABNORMAL LOW (ref 36.0–46.0)
Hemoglobin: 9.7 g/dL — ABNORMAL LOW (ref 12.0–15.0)
MCH: 24.3 pg — ABNORMAL LOW (ref 26.0–34.0)
MCHC: 32.4 g/dL (ref 30.0–36.0)
MCV: 74.8 fL — ABNORMAL LOW (ref 80.0–100.0)
Platelets: 208 10*3/uL (ref 150–400)
RBC: 4 MIL/uL (ref 3.87–5.11)
RDW: 16.2 % — ABNORMAL HIGH (ref 11.5–15.5)
WBC: 8.2 10*3/uL (ref 4.0–10.5)
nRBC: 0 % (ref 0.0–0.2)

## 2018-08-04 LAB — GLUCOSE, CAPILLARY
Glucose-Capillary: 123 mg/dL — ABNORMAL HIGH (ref 70–99)
Glucose-Capillary: 162 mg/dL — ABNORMAL HIGH (ref 70–99)

## 2018-08-04 MED ORDER — METFORMIN HCL 500 MG PO TABS
500.0000 mg | ORAL_TABLET | Freq: Two times a day (BID) | ORAL | Status: DC
  Administered 2018-08-04 – 2018-08-06 (×6): 500 mg via ORAL
  Filled 2018-08-04 (×7): qty 1

## 2018-08-04 NOTE — Progress Notes (Signed)
POSTOPERATIVE DAY # 1 S/P repeat CS with BTL  S:         Reports feeling ok - no PIH symptoms / little sore only             Tolerating po intake / no nausea / no vomiting / no flatus / no BM             Bleeding is spotting             Pain controlled with Motrin       Newborn in NICU  O:  VS: BP 132/85 (BP Location: Left Arm)   Pulse 83   Temp 98.1 F (36.7 C) (Oral)   Resp 18   Ht 5\' 2"  (1.575 m)   Wt 100.7 kg   LMP 11/16/2017   SpO2 100%   BMI 40.59 kg/m    BP: 132/85 - 125/83 - 143/86 - 147/95  LABS:      FBS 123 this am (took Metformin pre-pregnancy) Recent Labs    08/02/18 1035 08/04/18 0559  WBC 6.2 8.2  HGB 10.2* 9.7*  PLT 213 208  Results for WYNEMA, GAROUTTE (MRN [id number]) as of 08/04/2018 09:14  Ref. Range 08/03/2018 08:38 08/03/2018 18:02  Magnesium Latest Ref Range: 1.7 - 2.4 mg/dL  3.5 (H)  Alkaline Phosphatase Latest Ref Range: 38 - 126 U/L 74   Albumin Latest Ref Range: 3.5 - 5.0 g/dL 2.9 (L)   Uric Acid, Serum Latest Ref Range: 2.5 - 7.1 mg/dL 5.9   AST Latest Ref Range: 15 - 41 U/L 21   ALT Latest Ref Range: 0 - 44 U/L 18                Bloodtype: --/--/A POS (12/27 1035)  Rubella: Immune (06/28 0000)                                         I&O: net negative 716 with additional urine ~400        Physical Exam:             Alert and Oriented X3  Lungs: Clear and unlabored  Heart: regular rate and rhythm / no mumurs  Abdomen: soft, non-tender, non-distended             Fundus: firm, non-tender, Ueven             Dressing intact without drainage   Lochia: spotting  Extremities: no edema, no calf pain or tenderness  A:        POD # 1 S/P CS and BTL             Preeclampsia with good diuresis / asymptomatic              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

## 2018-08-04 NOTE — Lactation Note (Signed)
This note was copied from a baby's chart. Lactation Consultation Note  Patient Name: [name] JSHFW'Y Date: 08/04/2018   Visited with mom of an early term NICU female but as soon as LC came in the room, mom voice she's not BF or pumping, she's just going to bottle feed. [facility] [place] respected mom's wished and asked her to let us know if she changes her mind. Mom aware of [facility] services and will call if needed.  Maternal Data    Feeding Feeding Type: (P) Formula Nipple Type: (P) Slow - flow   Interventions    Lactation Tools Discussed/Used     Consult Status      Elvy Mclarty S Ashlee Bewley 08/04/2018, 3:12 PM

## 2018-08-05 ENCOUNTER — Encounter (HOSPITAL_COMMUNITY): Payer: Self-pay | Admitting: *Deleted

## 2018-08-05 LAB — GLUCOSE, CAPILLARY
Glucose-Capillary: 105 mg/dL — ABNORMAL HIGH (ref 70–99)
Glucose-Capillary: 95 mg/dL (ref 70–99)
Glucose-Capillary: 113 mg/dL — ABNORMAL HIGH (ref 70–99)
Glucose-Capillary: 114 mg/dL — ABNORMAL HIGH (ref 70–99)
Glucose-Capillary: 111 mg/dL — ABNORMAL HIGH (ref 70–99)

## 2018-08-05 NOTE — Progress Notes (Signed)
POSTOPERATIVE DAY # 2 S/P repeat CS with BTL  S:         Patient assessed in NICU.              Reports feeling ok - no PIH symptoms. C.section pain well controlled              Newborn in NICU due to poor feeding, but stable   O:  VS: BP 133/87 (BP Location: Left Arm)   Pulse 98   Temp 99.1 F (37.3 C)   Resp 16   Ht 5\' 2"  (1.575 m)   Wt 100.7 kg   LMP 11/16/2017   SpO2 99%   BMI 40.59 kg/m    Patient Vitals for the past 24 hrs:  BP Temp Temp src Pulse Resp SpO2  08/05/18 0850 133/87 99.1 F (37.3 C) - 98 16 99 %  08/05/18 0604 116/84 99.2 F (37.3 C) Oral 85 15 100 %  08/04/18 2321 114/67 98.4 F (36.9 C) Oral 94 17 99 %  08/04/18 1956 134/76 98.3 F (36.8 C) Oral 96 17 100 %  08/04/18 1240 135/83 98 F (36.7 C) Oral 87 18 100 %    LABS:    CBC Latest Ref Rng & Units 08/04/2018 08/02/2018 07/26/2018  WBC 4.0 - 10.5 K/uL 8.2 6.2 7.5  Hemoglobin 12.0 - 15.0 g/dL 9.7(L) 10.2(L) 11.1(L)  Hematocrit 36.0 - 46.0 % 29.9(L) 31.9(L) 33.6(L)  Platelets 150 - 400 K/uL 208 213 256   CMP Latest Ref Rng & Units 08/03/2018 07/26/2018 07/18/2018  Glucose 70 - 99 mg/dL 100(H) 94 117(H)  BUN 6 - 20 mg/dL 9 5(L) 7  Creatinine 0.44 - 1.00 mg/dL 0.51 0.52 0.49  Sodium 135 - 145 mmol/L 135 137 136  Potassium 3.5 - 5.1 mmol/L 3.3(L) 3.3(L) 3.3(L)  Chloride 98 - 111 mmol/L 111 108 109  CO2 22 - 32 mmol/L 18(L) 22 18(L)  Calcium 8.9 - 10.3 mg/dL 8.3(L) 8.9 8.6(L)  Total Protein 6.5 - 8.1 g/dL 6.1(L) 6.1(L) 5.7(L)  Total Bilirubin 0.3 - 1.2 mg/dL 0.7 0.6 0.5  Alkaline Phos 38 - 126 U/L 74 70 64  AST 15 - 41 U/L 21 21 23   ALT 0 - 44 U/L 18 19 13    No new labs today              Bloodtype: --/--/A POS (12/27 1035)  Rubella: Immune (06/28 0000)                                              Physical Exam:             Alert and Oriented X3  Lungs: Clear and unlabored  Heart: regular rate and rhythm / no mumurs  Abdomen: soft, non-tender, non-distended             Fundus: firm,  non-tender, Ueven             Dressing intact without drainage   Lochia: spotting  Extremities: no edema, no calf pain or tenderness  A/P      POD # 2 S/P 3rd repeat CS and BTL             GHTN with superimposed Preeclampsia with good diuresis / asymptomatic, stable labs. BP controlled well, continue Procardia 60mg  XL daily  IDA of pregnancy with post-op worsening, continue iron             Class A2 GDM- restarted Metformin 500mg  PO BID, will f/up with Dr Buddy Duty, endocrinologist  Discharge tomorrow if remains stable  BOY, NICU, circ plan with Dr Garwin Brothers before discharge   Elveria Royals MD 08/05/2018, 12:22 PM

## 2018-08-06 LAB — GLUCOSE, CAPILLARY
Glucose-Capillary: 105 mg/dL — ABNORMAL HIGH (ref 70–99)
Glucose-Capillary: 102 mg/dL — ABNORMAL HIGH (ref 70–99)

## 2018-08-06 MED ORDER — METFORMIN HCL 500 MG PO TABS: 500.0000 mg | ORAL_TABLET | Freq: Two times a day (BID) | ORAL | 3 refills | Status: DC

## 2018-08-06 MED ORDER — FERROUS SULFATE 325 (65 FE) MG PO TABS: 325.0000 mg | ORAL_TABLET | Freq: Every day | ORAL | 3 refills | Status: DC

## 2018-08-06 MED ORDER — HYDROMORPHONE HCL 4 MG PO TABS: 4.0000 mg | ORAL_TABLET | ORAL | 0 refills | Status: DC | PRN

## 2018-08-06 MED ORDER — SENNOSIDES-DOCUSATE SODIUM 8.6-50 MG PO TABS: 2.0000 | ORAL_TABLET | ORAL | 0 refills | Status: DC

## 2018-08-06 MED ORDER — POLYETHYLENE GLYCOL 3350 17 G PO PACK
17.0000 g | PACK | Freq: Every day | ORAL | Status: DC
  Administered 2018-08-06: 17 g via ORAL
  Filled 2018-08-06: qty 1

## 2018-08-06 MED ORDER — IBUPROFEN 800 MG PO TABS: 800.0000 mg | ORAL_TABLET | Freq: Three times a day (TID) | ORAL | 0 refills | Status: DC

## 2018-08-06 NOTE — Progress Notes (Addendum)
POSTOPERATIVE DAY # 3 S/P Repeat LTCS with BTL, baby boy in NICU   S:         Reports feeling okay; reports constipation and abdominal tenderness related to flatus and constipation.  Reports she has not had a good bowel movement since before last Friday  Planning to [place] with baby in NICU. Reports she has someone that is able to pick up her prescriptions   Denies HA, visual changes, RUQ/epigastric pain              Tolerating po intake / no nausea / no vomiting / + flatus / no BM  Denies dizziness, SOB, or CP             Bleeding is light             Pain controlled withDilaudid and Motrin             Up ad lib / ambulatory/ voiding QS  Newborn in NICU  / Circumcision - planned when baby is d/c'd from NICU   O:  VS: BP 134/68 (BP Location: Right Arm)   Pulse 97   Temp 98.4 F (36.9 C) (Oral)   Resp 17   Ht 5\' 2"  (1.575 m)   Wt 100.7 kg   LMP 11/16/2017   SpO2 100%   BMI 40.59 kg/m    BP  Patient Position (if appropriate)  SpO2  O2 Device  Weight Who            08/06/18 07:45:03  98.4 F (36.9 C)  97  -  17  134/68  Lying  100 %  Room Air  - DG   08/06/18 04:59:05  98.5 F (36.9 C)  90  -  17  129/72  Lying  100 %  Room Air  - CS   08/05/18 23:08:53  98.6 F (37 C)  104Abnormal   -  17  133/87  Sitting  100 %  Room Air  - CS   08/05/18 20:42:14  99.4 F (37.4 C)  105Abnormal   -  17  121/77  Sitting  98 %  Room Air  - CS   08/05/18 16:05:36  98.2 F (36.8 C)  108Abnormal   -  -  129/81  Sitting  100 %  Room Air  - TS   08/05/18 08:50:26  99.1 F (37.3 C)  98  -  16  133/87  Sitting  99 %  Room Air  - TS   08/05/18 06:04:24  99.2 F (37.3 C)  85  -  15  116/84  Sitting             LABS:               Recent Labs    08/04/18 0559  WBC 8.2  HGB 9.7*  PLT 208   Results for orders placed or performed during the hospital encounter of 08/03/18 (from the past 72 hour(s))  Glucose, capillary     Status: Abnormal   Collection Time: 08/03/18 11:26 AM  Result  Value Ref Range   Glucose-Capillary 105 (H) 70 - 99 mg/dL  Glucose, capillary     Status: None   Collection Time: 08/03/18  1:02 PM  Result Value Ref Range   Glucose-Capillary 95 70 - 99 mg/dL  Magnesium     Status: Abnormal   Collection Time: 08/03/18  6:02 PM  Result Value Ref Range   Magnesium 3.5 (H) 1.7 - 2.4  mg/dL    Comment: Performed at [facility], [address], [place], [place] [address]  Glucose, capillary     Status: Abnormal   Collection Time: 08/03/18  8:35 PM  Result Value Ref Range   Glucose-Capillary 157 (H) 70 - 99 mg/dL  Glucose, capillary     Status: Abnormal   Collection Time: 08/03/18 11:05 PM  Result Value Ref Range   Glucose-Capillary 158 (H) 70 - 99 mg/dL  CBC     Status: Abnormal   Collection Time: 08/04/18  5:59 AM  Result Value Ref Range   WBC 8.2 4.0 - 10.5 K/uL   RBC 4.00 3.87 - 5.11 MIL/uL   Hemoglobin 9.7 (L) 12.0 - 15.0 g/dL   HCT 29.9 (L) 36.0 - 46.0 %   MCV 74.8 (L) 80.0 - 100.0 fL   MCH 24.3 (L) 26.0 - 34.0 pg   MCHC 32.4 30.0 - 36.0 g/dL   RDW 16.2 (H) 11.5 - 15.5 %   Platelets 208 150 - 400 K/uL   nRBC 0.0 0.0 - 0.2 %    Comment: Performed at [facility], [address], [place], [place] [address]  Glucose, capillary     Status: Abnormal   Collection Time: 08/04/18  8:41 AM  Result Value Ref Range   Glucose-Capillary 123 (H) 70 - 99 mg/dL   Comment 1 Notify RN   Glucose, capillary     Status: Abnormal   Collection Time: 08/04/18  9:32 PM  Result Value Ref Range   Glucose-Capillary 162 (H) 70 - 99 mg/dL   Comment 1 Notify RN   Glucose, capillary     Status: Abnormal   Collection Time: 08/05/18  6:05 AM  Result Value Ref Range   Glucose-Capillary 113 (H) 70 - 99 mg/dL   Comment 1 Notify RN   Glucose, capillary     Status: Abnormal   Collection Time: 08/05/18  6:14 PM  Result Value Ref Range   Glucose-Capillary 114 (H) 70 - 99 mg/dL  Glucose, capillary     Status: Abnormal   Collection Time: 08/05/18 11:06 PM  Result  Value Ref Range   Glucose-Capillary 111 (H) 70 - 99 mg/dL   Comment 1 Notify RN    Comment 2 Document in Chart   Glucose, capillary     Status: Abnormal   Collection Time: 08/06/18  8:37 AM  Result Value Ref Range   Glucose-Capillary 105 (H) 70 - 99 mg/dL   Comment 1 Notify RN    Comment 2 Document in Chart                Bloodtype: --/--/A POS (12/27 1035)  Rubella: Immune (06/28 0000)                                             I&O: Intake/Output      12/30 0701 - 12/31 0700 12/31 0701 - 01/01 0700   P.O.     I.V. (mL/kg)     Total Intake(mL/kg)     Urine (mL/kg/hr)     Total Output     Net                       Physical Exam:             Alert and Oriented X3  Lungs: Clear and unlabored  Heart: regular  rate and rhythm / no murmurs  Abdomen: soft, non-tender, distention noted; mild tenderness over bowel distention - peristalsis noted when palpating; likely distended with stool and flatus              Fundus: firm, non-tender, U-2             Dressing: honeycomb dressing with steri-strips c/d/i             Incision:  approximated with sutures / no erythema / no ecchymosis / no drainage  Perineum: intact  Lochia: small, no clots   Extremities: trace pedal edema, no calf pain or tenderness, +2 DTRs, no clonus bilaterally   A/P:      POD # 3 S/P 3rd repeat CS and BTL             GHTN with superimposed Preeclampsia with good diuresis / asymptomatic, stable labs. BP controlled well, continue Procardia 60mg  XL daily              IDA of pregnancy with post-op worsening, continue iron             Class A2 GDM- restarted Metformin 500mg  PO BID, will f/up with Dr Buddy Duty, endocrinologist in 1 week; continue checking fasting and 2 hr PP CBGs  Constipation with abdominal distention    - recommended Dulcolax suppository; pt. Would like to try Miralax first. Advised to call in no BM in 2 days   Discharge home today  WOB discharge book, warning s/s and instructions reviewed   F/u in 1  week for BP check   Consult for plan: Dr. Janae Sauce, MSN, CNM [facility]

## 2018-08-06 NOTE — Progress Notes (Signed)
Patient discharged to "room-in" with her infant in the NICU. Discharge teaching, home care, s/s PIH, prescriptions, and follow-up appts discussed. Pt verbalized understanding.

## 2018-08-06 NOTE — Discharge Summary (Signed)
Obstetric Discharge Summary   Patient Name: [name] DOB: Feb 24, 1976 MRN: [id number]  Date of Admission: 08/03/2018 Date of Discharge: 08/06/2018 Date of Delivery: 08/03/18 Gestational Age at Delivery: [redacted]w[redacted]d  Primary OB: [name] - Dr. Garwin Warren  Antepartum complications:  previous C/S x 2, desires sterilization. PNC complicated by Class A2 GDm, unexplained elev MSAFP, GBS cx (+). Gest HTN, AMA Prenatal Labs:  ABO, Rh: --/--/A POS (12/27 1035) Antibody: NEG (12/27 1035) Rubella: Immune (06/28 0000) RPR: Non Reactive (12/27 1035)  HBsAg: Negative (06/28 0000)  HIV: Non-reactive (06/28 0000)  GBS: Positive (12/04 0000)  Admitting Diagnosis: Repeat LTCS, GHTN, AMA, desires sterilization   Secondary Diagnoses: Patient Active Problem List   Diagnosis Date Noted  . Previous cesarean section 08/03/2018  . Fetal heart rate decelerations affecting management of mother 07/18/2018  . Gestational htn w/o significant proteinuria, third trimester 07/10/2018  . GDM, class A2 07/17/2016  . Postpartum care following cesarean delivery (12/28) 07/17/2016    Date of Delivery: 08/03/18 Delivered By: Dr. Rolena Warren. [name], CNM assist  Delivery Type: repeat cesarean section, low transverse incision   Newborn Data: Live born female  Birth Weight: 7 lb 7.2 oz (3380 g) APGAR: 8, 7  Newborn Delivery   Birth date/time:  08/03/2018 10:09:00 Delivery type:  C-Section, Vacuum Assisted Trial of labor:  No C-section categorization:  Repeat        Hospital/Postpartum Course (Cesarean Section):  Pt. Admitted for repeat LTCS for GHTN, AMA, desired sterilization. See notes, and op note for delivery details. Patient required magnesium sulfate x 24 hrs after delivery.  By time of discharge on POD#3, her pain was controlled on oral pain medications; she had appropriate lochia and was ambulating, voiding without difficulty, tolerating regular diet and passing flatus.   She was deemed stable  for discharge to home.     Labs: CBC Latest Ref Rng & Units 08/04/2018 08/02/2018 07/26/2018  WBC 4.0 - 10.5 K/uL 8.2 6.2 7.5  Hemoglobin 12.0 - 15.0 g/dL 9.7(L) 10.2(L) 11.1(L)  Hematocrit 36.0 - 46.0 % 29.9(L) 31.9(L) 33.6(L)  Platelets 150 - 400 K/uL 208 213 256   A POS  Physical exam:  BP 131/89 (BP Location: Left Arm)   Pulse 92   Temp 98.6 F (37 C) (Oral)   Resp 18   Ht 5\' 2"  (1.575 m)   Wt 100.7 kg   LMP 11/16/2017   SpO2 100%   BMI 40.59 kg/m  General: alert and no distress Pulm: normal respiratory effort Lochia: appropriate Abdomen: soft, NT Uterine Fundus: firm, below umbilicus Perineum: healing well, no significant erythema, no significant edema ncision: c/d/i, healing well, no significant drainage, no dehiscence, no significant erythema Extremities: No evidence of DVT seen on physical exam. No lower extremity edema.   Disposition: stable, discharge to home Baby Disposition: will room in mom in NICU  Contraception: s/p BTL   Rh Immune globulin given: N/A Rubella vaccine given: N/A Tdap vaccine given in AP or PP setting: UTD Flu vaccine given in AP or PP setting: UTD   Plan:  [name] was discharged to home in good condition. Follow-up appointment at [facility] in 1 week for BP check.  F/u with Dr. Buddy Warren endocrinology in 1 week for CBG check.   Discharge Instructions: Per After Visit Summary. Refer to After Visit Summary and [facility] discharge booklet  Activity: Advance as tolerated. Pelvic rest for 6 weeks.   Diet: Regular, Heart Healthy Discharge Medications: Allergies as of 08/06/2018  Reactions   Codeine Anaphylaxis      Medication List    TAKE these medications   ferrous sulfate 325 (65 FE) MG tablet Take 1 tablet (325 mg total) by mouth daily.   HYDROmorphone 4 MG tablet Commonly known as:  DILAUDID Take 1 tablet (4 mg total) by mouth every 4 (four) hours as needed for severe pain.   ibuprofen 800 MG  tablet Commonly known as:  ADVIL,MOTRIN Take 1 tablet (800 mg total) by mouth every 8 (eight) hours.   metFORMIN 500 MG tablet Commonly known as:  GLUCOPHAGE Take 1 tablet (500 mg total) by mouth 2 (two) times daily with a meal.   NIFEdipine 60 MG 24 hr tablet Commonly known as:  ADALAT CC Take 1 tablet (60 mg total) by mouth daily.   PRENATAL GUMMIES/DHA & FA 0.4-32.5 MG Chew Chew 2 each by mouth daily.   senna-docusate 8.6-50 MG tablet Commonly known as:  Senokot-S Take 2 tablets by mouth daily. Start taking on:  August 07, 2018      Outpatient follow up:  Follow-up Information    Melanie Salina, MD Follow up in 1 week(s).   Specialty:  Obstetrics and Gynecology Why:  Blood pressure check; then 6 week PP visit Contact information: [name] [id number] [phone]           Signed:  Lars Pinks, MSN, CNM [name]

## 2018-10-04 ENCOUNTER — Other Ambulatory Visit: Payer: Self-pay | Admitting: Obstetrics and Gynecology

## 2018-10-07 ENCOUNTER — Ambulatory Visit (HOSPITAL_COMMUNITY)
Admission: RE | Admit: 2018-10-07 | Discharge: 2018-10-07 | Disposition: A | Discharge status: Home / Self Care | Payer: BC Managed Care – PPO | Source: Ambulatory Visit | Attending: Obstetrics and Gynecology | Admitting: Obstetrics and Gynecology

## 2018-10-07 DIAGNOSIS — D509 Iron deficiency anemia, unspecified: Secondary | ICD-10-CM | POA: Diagnosis not present

## 2018-10-07 MED ORDER — SODIUM CHLORIDE 0.9 % IV SOLN
INTRAVENOUS | Status: DC | PRN
  Administered 2018-10-07: 250 mL via INTRAVENOUS

## 2018-10-07 MED ORDER — SODIUM CHLORIDE 0.9 % IV SOLN
510.0000 mg | Freq: Once | INTRAVENOUS | Status: AC
  Administered 2018-10-07: 510 mg via INTRAVENOUS
  Filled 2018-10-07: qty 17

## 2018-10-07 NOTE — Progress Notes (Signed)
PATIENT CARE CENTER NOTE  Diagnosis: Iron Deficiency Anemia    Provider: Dr. Servando Salina   Procedure: IV Feraheme    Note: Patient received Feraheme infusion. Tolerated well with no adverse reaction. Observed patient for 30 minutes post-infusion. Vital signs stable. Discharge instructions given. Patient to come back next week for second Feraheme infusion.  Alert, oriented and ambulatory at discharge.

## 2018-10-07 NOTE — Discharge Instructions (Signed)

## 2018-10-14 ENCOUNTER — Ambulatory Visit (HOSPITAL_COMMUNITY)
Admission: RE | Admit: 2018-10-14 | Discharge: 2018-10-14 | Disposition: A | Discharge status: Home / Self Care | Payer: BC Managed Care – PPO | Source: Ambulatory Visit | Attending: Obstetrics and Gynecology | Admitting: Obstetrics and Gynecology

## 2018-10-14 DIAGNOSIS — D509 Iron deficiency anemia, unspecified: Secondary | ICD-10-CM | POA: Diagnosis not present

## 2018-10-14 MED ORDER — SODIUM CHLORIDE 0.9 % IV SOLN
INTRAVENOUS | Status: DC | PRN
  Administered 2018-10-14: 250 mL via INTRAVENOUS

## 2018-10-14 MED ORDER — SODIUM CHLORIDE 0.9 % IV SOLN
510.0000 mg | Freq: Once | INTRAVENOUS | Status: AC
  Administered 2018-10-14: 510 mg via INTRAVENOUS
  Filled 2018-10-14: qty 17

## 2018-10-14 NOTE — Discharge Instructions (Signed)

## 2018-10-14 NOTE — Progress Notes (Signed)
PATIENT CARE CENTER NOTE  Diagnosis: Iron Deficiency Anemia    Provider: Dr. Servando Salina   Procedure: IV Feraheme    Note: Patient received second Feraheme infusion. Tolerated well with no adverse reaction. Observed patient for 30 minutes post-infusion. Vital signs stable. Discharge instructions given. Alert, oriented and ambulatory at discharge.

## 2018-11-15 ENCOUNTER — Encounter (HOSPITAL_COMMUNITY): Payer: Self-pay

## 2020-08-02 IMAGING — US US MFM FETAL BPP W/O NON-STRESS
1 series · 12 of 28 positions shown · non-contrast
Comparison: none

[Series 1: us mfm fetal bpp w/o non-stress · 29 acquisitions, 12 frames shown]
[im 2/29]
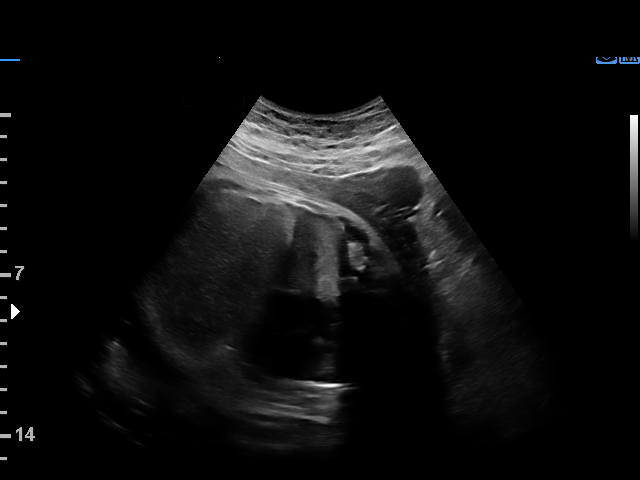
[im 4/29]
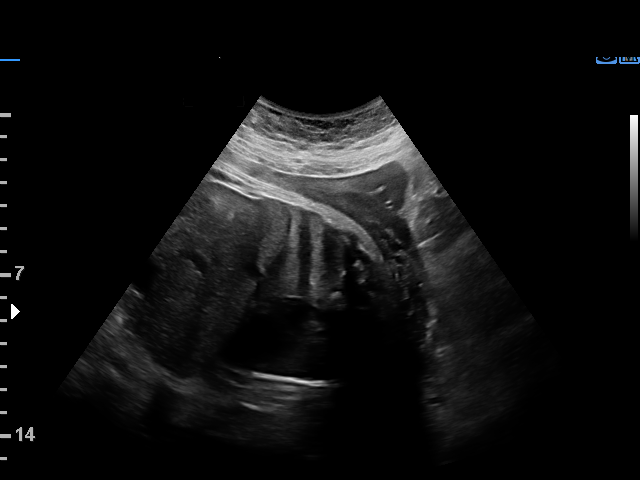
[im 6/29]
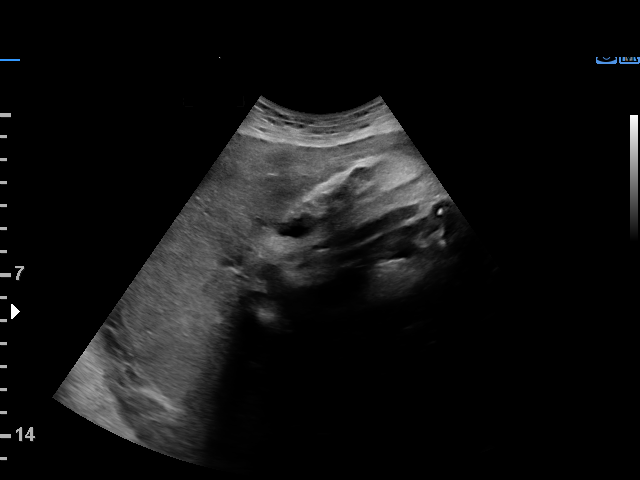
[im 9/29]
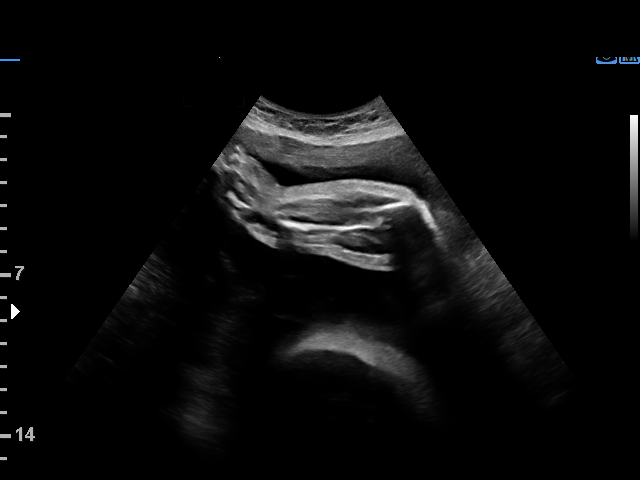
[im 11/29]
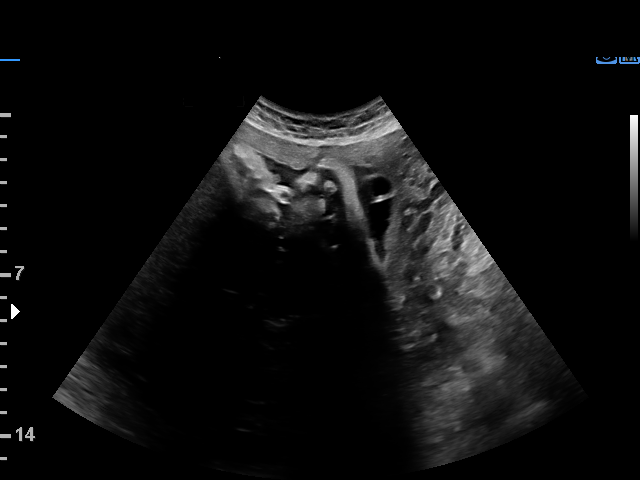
[im 13/29]
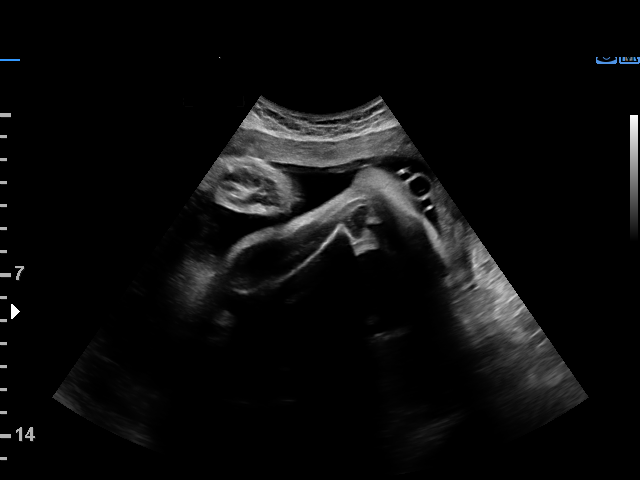
[im 16/29]
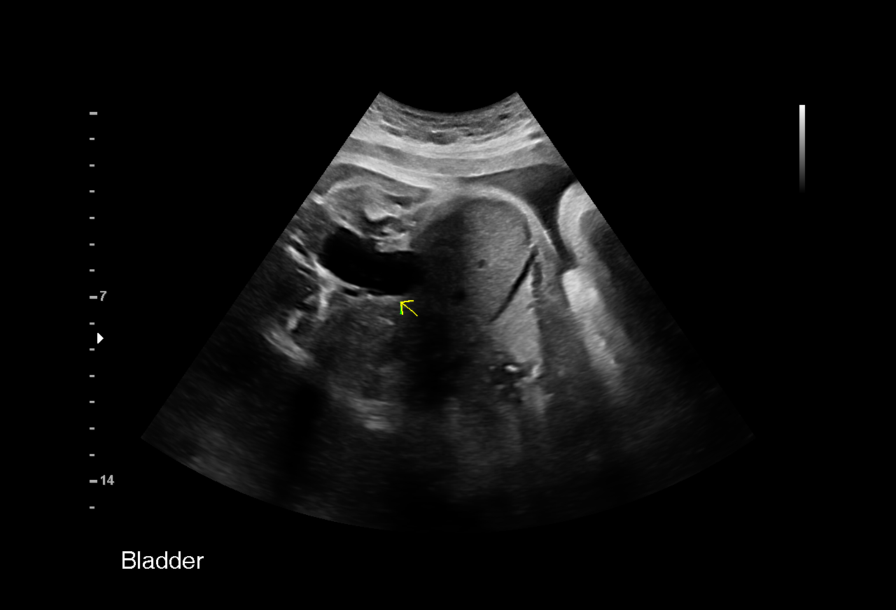
[im 18/29]
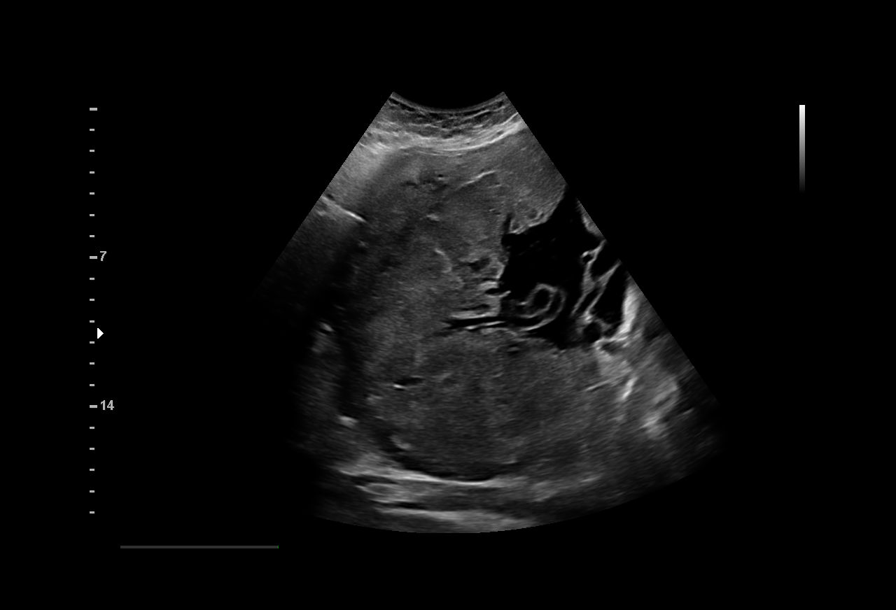
[im 20/29]
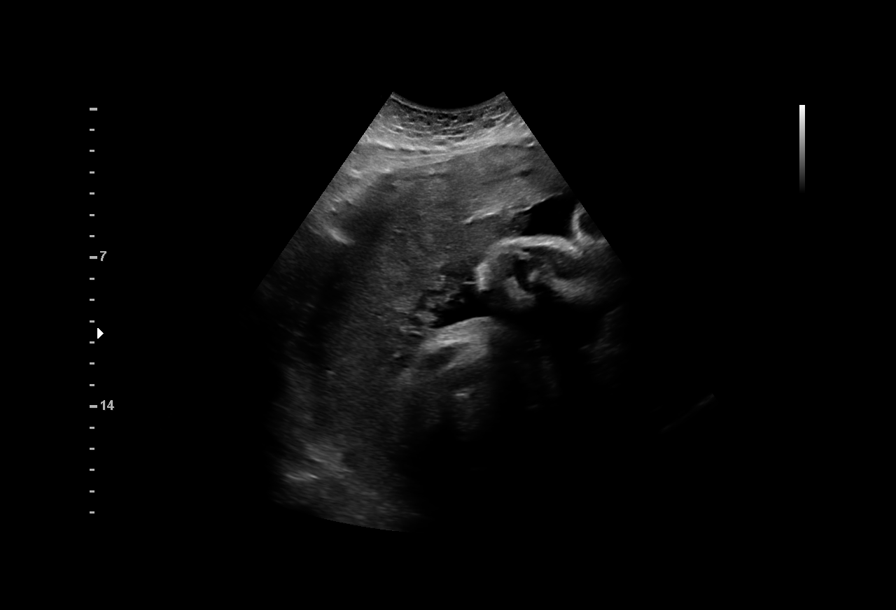
[im 23/29]
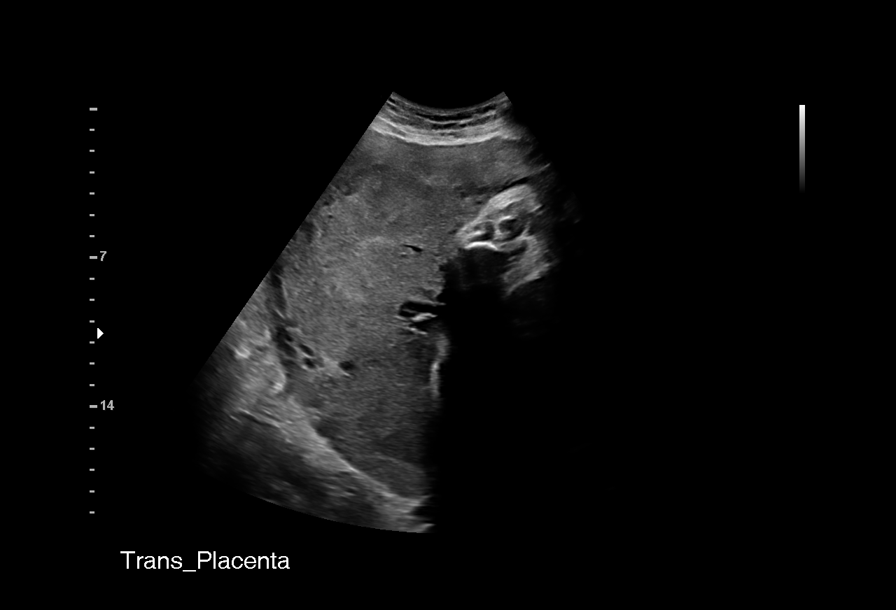
[im 25/29]
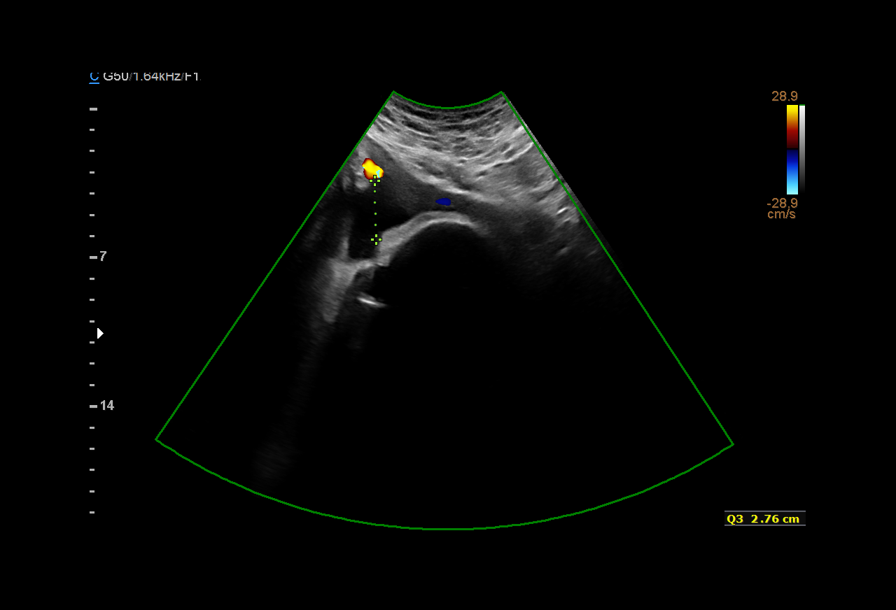
[im 27/29]
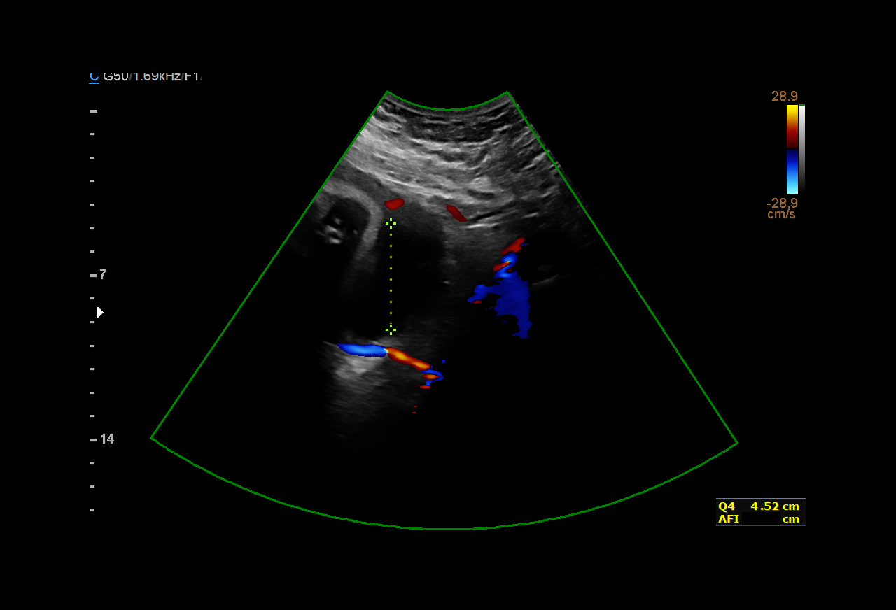

[12 of 28 positions shown; findings below may reference images not displayed]

OB/GYN &
                                                            [facility]

 ----------------------------------------------------------------------

 ----------------------------------------------------------------------
Indications

  Advanced maternal age multigravida 35+,
  third trimester
  32 weeks gestation of pregnancy
  Abnormal biochemical screen (elevated AFP)
  History of cesarean delivery, currently
  pregnant x 2
  Gestational diabetes in pregnancy, insulin
  controlled
  Poor obstetric history: Previous preterm
  delivery, antepartum (@13w0d)
 ----------------------------------------------------------------------
Vital Signs

 BMI:         38.1         Pulse:  79
 BP:          134/80
Fetal Evaluation

 Num Of Fetuses:         1
 Fetal Heart Rate(bpm):  138
 Cardiac Activity:       Observed
 Presentation:           Cephalic
 Placenta:               Right lateral
 P. Cord Insertion:      Previously Visualized

 Amniotic Fluid
 AFI FV:      Within normal limits
 AFI Sum(cm)     %Tile       Largest Pocket(cm)
 13.73           45

 RUQ(cm)       RLQ(cm)       LUQ(cm)        LLQ(cm)

Biophysical Evaluation

 Amniotic F.V:   Within normal limits       F. Tone:        Observed
 F. Movement:    Observed                   Score:          [DATE]
 F. Breathing:   Observed
OB History

 Gravidity:    6         Term:   1        Prem:   1        SAB:   3
 TOP:          0       Ectopic:  0        Living: 2
Gestational Age

 LMP:           32w 5d        Date:  11/16/17                 EDD:   08/23/18
 Best:          32w 5d     Det. By:  LMP  (11/16/17)          EDD:   08/23/18
Cervix Uterus Adnexa

 Cervix
 Not visualized (advanced GA >39wks)
Impression

 Gestational diabetes. Patient takes insulin.

 Amniotic fluid is normal and good fetal activity is seen.
 Antenatal testing is reassuring. BPP [DATE].

 BP: 134/80 mm Hg.
Recommendations

 Continue weekly antenatal testing till delivery.
                 Samba, Beda

## 2020-12-02 ENCOUNTER — Other Ambulatory Visit: Payer: Self-pay | Admitting: Obstetrics and Gynecology

## 2020-12-02 DIAGNOSIS — N923 Ovulation bleeding: Secondary | ICD-10-CM

## 2020-12-08 ENCOUNTER — Ambulatory Visit
Admission: RE | Admit: 2020-12-08 | Discharge: 2020-12-08 | Disposition: A | Discharge status: Home / Self Care | Payer: BC Managed Care – PPO | Source: Ambulatory Visit | Attending: Obstetrics and Gynecology | Admitting: Obstetrics and Gynecology

## 2020-12-08 DIAGNOSIS — N923 Ovulation bleeding: Secondary | ICD-10-CM

## 2020-12-20 ENCOUNTER — Other Ambulatory Visit: Payer: Self-pay | Admitting: Obstetrics and Gynecology

## 2020-12-20 DIAGNOSIS — D259 Leiomyoma of uterus, unspecified: Secondary | ICD-10-CM

## 2020-12-29 ENCOUNTER — Other Ambulatory Visit: Payer: Self-pay | Admitting: Interventional Radiology

## 2020-12-29 ENCOUNTER — Ambulatory Visit
Admission: RE | Admit: 2020-12-29 | Discharge: 2020-12-29 | Disposition: A | Discharge status: Home / Self Care | Payer: BC Managed Care – PPO | Source: Ambulatory Visit | Attending: Obstetrics and Gynecology | Admitting: Obstetrics and Gynecology

## 2020-12-29 ENCOUNTER — Encounter: Payer: Self-pay | Admitting: *Deleted

## 2020-12-29 ENCOUNTER — Other Ambulatory Visit: Payer: Self-pay

## 2020-12-29 DIAGNOSIS — D259 Leiomyoma of uterus, unspecified: Secondary | ICD-10-CM

## 2020-12-29 HISTORY — PX: IR RADIOLOGIST EVAL & MGMT: IMG5224

## 2020-12-29 NOTE — Consult Note (Signed)
Chief Complaint: Patient was seen in consultation today for abnormal uterine bleeding  Referring Physician(s): Cousins,Sheronette  Patient Status: [facility] - Out-pt  History of Present Illness: [name] is a 45 y.o. G43P3 female with history of type 2 diabetes, gestational diabetes, hypertension, and abnormal uterine bleeding.  She presents today via telephone visit as a gracious referral from Dr. Norville Haggard.  She first noticed very heavy periods which started in February 2021.  This continued with each cycle which remained regular until early April 2022, where she reports bleeding for 20 days straight into early May.  This bleeding started with spotting then became very heavy, soaking through pads regularly, and with blood clots.  This stopped on Thursday, May 19.  She is anticipating additional bleeding as she is ste to resume her normal cycle within the next couple days.  She was recently due for a pap smear, but it was unable to be obtained due to bleeding.  Therefore, a pelvic ultrasound was ordered which revealed a leiomyomatous uterus, the largest fibroid measuring up to 5.7 cm.   In addition to bleeding, she endorses cyclical constipation and pelvic aching with has occurred over the past year.  She denies dyspareunia but endorses occasional post coital ache.    She has been prescribe megestrol to help with her symptoms, however she hasn't started taking it out of fear of weight gain and worsening her diabetes.  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   Past Medical History:  Diagnosis Date  . Diabetes mellitus without complication (HCC)    gestational  . Gestational diabetes   . Postpartum care following cesarean delivery Indication: Repeat, SROM (12/11) 07/17/2016  . Pregnancy induced hypertension     Past  Surgical History:  Procedure Laterality Date  . BREAST LUMPECTOMY    . BREAST SURGERY    . CESAREAN SECTION    . CESAREAN SECTION N/A 07/17/2016   Procedure: CESAREAN SECTION;  Surgeon: Servando Salina, MD;  Location: [place];  Service: Obstetrics;  Laterality: N/A;  . CESAREAN SECTION WITH BILATERAL TUBAL LIGATION N/A 08/03/2018   Procedure: Repeat CESAREAN SECTION;  Surgeon: Servando Salina, MD;  Location: [place];  Service: Obstetrics;  Laterality: N/A;  EDD: 08/23/18 Allergy: Codeine    Allergies: Codeine  Medications: Prior to Admission medications   Medication Sig Start Date End Date Taking? Authorizing Provider  ferrous sulfate 325 (65 FE) MG tablet Take 1 tablet (325 mg total) by mouth daily. 08/06/18 08/06/19  Darliss Cheney, CNM  HYDROmorphone (DILAUDID) 4 MG tablet Take 1 tablet (4 mg total) by mouth every 4 (four) hours as needed for severe pain. 08/06/18   Sigmon, Tyler Deis, CNM  ibuprofen (ADVIL,MOTRIN) 800 MG tablet Take 1 tablet (800 mg total) by mouth every 8 (eight) hours. 08/06/18   Darliss Cheney, CNM  metFORMIN (GLUCOPHAGE) 500 MG tablet Take 1 tablet (500 mg total) by mouth 2 (two) times daily with a meal. 08/06/18   Sigmon, Tyler Deis, CNM  NIFEdipine (ADALAT CC) 60 MG 24 hr tablet Take 1 tablet (60 mg total) by mouth daily. 07/21/18   Servando Salina, MD  Prenatal MV-Min-FA-Omega-3 (PRENATAL GUMMIES/DHA & FA) 0.4-32.5 MG CHEW Chew 2 each by mouth daily.    [provider]  senna-docusate (SENOKOT-S) 8.6-50 MG tablet Take 2 tablets by mouth  daily. 08/07/18   Darliss Cheney, CNM     Family History  Problem Relation Age of Onset  . Diabetes Mother   . Hypertension Mother   . Diabetes Sister   . Diabetes Brother   . Heart disease Brother     Social History   Socioeconomic History  . Marital status: Married    Spouse name: Not on file  . Number of children: Not on file  . Years of education: Not on file   . Highest education level: Not on file  Occupational History  . Occupation: Print production planner  Tobacco Use  . Smoking status: Never Smoker  . Smokeless tobacco: Never Used  Vaping Use  . Vaping Use: Never used  Substance and Sexual Activity  . Alcohol use: Not Currently    Comment: ocassional   . Drug use: No  . Sexual activity: Yes    Birth control/protection: None  Other Topics Concern  . Not on file  Social History Narrative  . Not on file   Social Determinants of Health   Financial Resource Strain: Not on file  Food Insecurity: Not on file  Transportation Needs: Not on file  Physical Activity: Not on file  Stress: Not on file  Social Connections: Not on file     Review of Systems: A 12 point ROS discussed and pertinent positives are indicated in the HPI above.  All other systems are negative.  Vital Signs: There were no vitals taken for this visit.  No physical examination performed in lieu of telephone visit.   Imaging: US PELVIC COMPLETE WITH TRANSVAGINAL  Result Date: 12/08/2020 CLINICAL DATA:  Dysfunctional uterine bleeding, LMP 11/05/2020 EXAM: TRANSABDOMINAL AND TRANSVAGINAL ULTRASOUND OF PELVIS TECHNIQUE: Both transabdominal and transvaginal ultrasound examinations of the pelvis were performed. Transabdominal technique was performed for global imaging of the pelvis including uterus, ovaries, adnexal regions, and pelvic cul-de-sac. It was necessary to proceed with endovaginal exam following the transabdominal exam to visualize the myometrium and ovaries bilaterally. COMPARISON:  None FINDINGS: Uterus Measurements: 12.9 x 5.6 x 6.8 cm = volume: 255 mL. Of the myometrial echotexture is coarsened. Additionally, multiple heterogeneously I so to hypoechoic masses are seen scattered throughout the uterine corpus in keeping with multiple subserosal and intramural uterine fibroids measuring up to 5.7 x 4.5 x 4.9 cm within the uterine fundus. There is at least 1 additional  submucosal uterine fibroid noted within the a posterior lower uterine segment measuring 2.6 x 2.2 x 2.5 cm. A defect in keeping with a Caesarean section scar is seen within the lower anterior uterine segment. The cervix is unremarkable. Endometrium Thickness: 9 mm.  No focal abnormality visualized. Right ovary Measurements: 4.8 x 3.4 x 3.7 cm = volume: 32 mL. The right ovary contains a 5.8 x 4.5 x 4.9 cm simple cyst. Left ovary Measurements: 3.6 x 1.9 x 2.3 cm = volume: 8 mL. Normal appearance/no adnexal mass. Other findings Small simple appearing free fluid is seen within the cul-de-sac, possibly physiologic. IMPRESSION: Coarsening of the myometrial echotexture with multiple intrauterine masses identified in keeping with multiple uterine fibroids measuring up to 5.7 cm in size. At least 1 submucosal uterine fibroid is identified. 5.8 cm right ovarian simple cyst. Recommend follow-up US in 3-6 months. Note: This recommendation does not apply to premenarchal patients or to those with increased risk (genetic, family history, elevated tumor markers or other high-risk factors) of ovarian cancer. Reference: Radiology 2019 Nov; 293(2):359-371. Electronically Signed   By: Fidela Salisbury MD  On: 12/08/2020 21:22    Labs:  CBC: No results for input(s): WBC, HGB, HCT, PLT in the last 8760 hours.  COAGS: No results for input(s): INR, APTT in the last 8760 hours.  BMP: No results for input(s): NA, K, CL, CO2, GLUCOSE, BUN, CALCIUM, CREATININE, GFRNONAA, GFRAA in the last 8760 hours.  Invalid input(s): CMP  LIVER FUNCTION TESTS: No results for input(s): BILITOT, AST, ALT, ALKPHOS, PROT, ALBUMIN in the last 8760 hours.  TUMOR MARKERS: No results for input(s): AFPTM, CEA, CA199, CHROMGRNA in the last 8760 hours.  Assessment and Plan:  Assessment: OMAYRA TULLOCH is a 45 y.o. female presenting to IR clinic for discussion of symptomatic uterine fibroids.  Her primary symptoms are menometrorrhagia, and she  has previously tried nothing.  The patient was counseled on options for treatment of uterine fibroids with their accompanying symptoms of heavy menses, painful periods, and/or bulk symptoms including doing nothing, hormonal therapy, myomectomy, hysterectomy, and uterine artery embolization.  We discussed uterine artery embolization ([place]) as a potential therapy.  We described the procedure itself, including the possibility of using analgesia for pain control, a bladder catheter, and possible admission to the hospital for a 23 hour inpatient observation period.  The procedure is done under conscious sedation.  We quoted an efficacy of 90% for improvement of menorrhagia and of 75% for improvement of bulk symptoms at one year related to fibroids.  We informed the patient that approximately 80% of those patients described sustained benefits from the procedure at 5 years.  We discussed secondary hysterectomy rates for persistent symptoms of 3%, 4%, 10%, and 17% at 1, 2, 3, and 5 years, respectively.  IF the patient also had adenomyosis as a potential contributing cause for her symptoms, we quoted a higher rate of recurrent symptoms after [place], being 30-50% at 3-5 years.    We also quoted a 1 in 250-500 (0.2-0.4%) incidence of requiring emergent or semi-emergent hysterectomy due a procedural complication, as well an as approximately 7% chance of early onset menopause, mostly in women >10 years of age, with a less than 1% risk for women less than 33 years of age.  We discussed the need to evaluate the ovarian arteries and in some cases the need to treat the fibroids via the ovarian arteries which can lead to a higher risk of early menopause.  We discussed that there have been numerous successful full-term pregnancies after [place], with a reported pregnancy rate of 40% in women desiring pregnancy, with a slightly higher risk for abnormal placentation after [place].  However, we also explained that abnormal placentation can also  be seen after a D&C procedure, C-section, and hysteroscopic myomectomy.  We also discussed the risk of an occult malignancy in patients with symptomatic fibroids being anywhere from 1 in 350 to 1 in 8,000 (0.0125-0.3%), depending upon the patient's age, symptom complex, family risk factors, and appearance of the fibroid on her fortcoming MRI study.  We also briefly discussed surgical options of treatment offered by our Gynecology colleagues with hysterectomy, myomectomy and/or endometrial ablation.    After her visit today she is most interested in uterine artery embolization.   Plan: 1. Obtain MRI pelvis with contrast. 2. Tentatively plan to proceed with uterine artery embolization, pending MRI results.   3. Sedation plan:  Moderate 4. Planned access site:  Radial 5. Contrast premedication: Not necessary 6. Labs prior to procedure: CBC, BMP 7. Post-procedure observation plan: Plan for same day discharge home, patient is prepared to stay  overnight if needed 8. Medications to be held prior to procedure: None 9. Immediately prior to procedure: Ancef 2 g IV, decadron 10 mg IV, Zofran 8 mg IV, Oxycodone 10 mg ER PO, Tylenol 1 g PO, insert Foley catheter     Thank you for this interesting consult.  I greatly enjoyed meeting BRIEA MCENERY and look forward to participating in their care.  A copy of this report was sent to the requesting provider on this date.  Electronically Signed: Suzette Battiest, MD 12/29/2020, 8:14 AM   I spent a total of 60 Minutes in telephone clinical consultation, greater than 50% of which was counseling/coordinating care for abnormal uterine bleeding, uterine fibroids.

## 2021-01-04 ENCOUNTER — Ambulatory Visit (HOSPITAL_COMMUNITY)
Admit: 2021-01-04 | Discharge: 2021-01-04 | Disposition: A | Discharge status: Home / Self Care | Payer: BC Managed Care – PPO | Source: Ambulatory Visit | Attending: Interventional Radiology | Admitting: Interventional Radiology

## 2021-01-04 ENCOUNTER — Other Ambulatory Visit: Payer: Self-pay

## 2021-01-04 DIAGNOSIS — D259 Leiomyoma of uterus, unspecified: Secondary | ICD-10-CM

## 2021-01-04 DIAGNOSIS — N800 Endometriosis of uterus: Secondary | ICD-10-CM | POA: Diagnosis not present

## 2021-01-04 DIAGNOSIS — D251 Intramural leiomyoma of uterus: Secondary | ICD-10-CM | POA: Diagnosis not present

## 2021-01-04 MED ORDER — GADOBUTROL 1 MMOL/ML IV SOLN
10.0000 mL | Freq: Once | INTRAVENOUS | Status: AC | PRN
  Administered 2021-01-04: 10 mL via INTRAVENOUS

## 2021-01-10 ENCOUNTER — Other Ambulatory Visit: Payer: Self-pay | Admitting: Interventional Radiology

## 2021-01-10 DIAGNOSIS — D259 Leiomyoma of uterus, unspecified: Secondary | ICD-10-CM

## 2021-01-11 ENCOUNTER — Other Ambulatory Visit: Payer: Self-pay

## 2021-01-11 ENCOUNTER — Ambulatory Visit
Admission: RE | Admit: 2021-01-11 | Discharge: 2021-01-11 | Disposition: A | Discharge status: Home / Self Care | Payer: BC Managed Care – PPO | Source: Ambulatory Visit | Attending: Interventional Radiology | Admitting: Interventional Radiology

## 2021-01-11 ENCOUNTER — Encounter: Payer: Self-pay | Admitting: *Deleted

## 2021-01-11 DIAGNOSIS — D259 Leiomyoma of uterus, unspecified: Secondary | ICD-10-CM

## 2021-01-11 HISTORY — PX: IR RADIOLOGIST EVAL & MGMT: IMG5224

## 2021-01-11 NOTE — Progress Notes (Signed)
Referring Physician(s): Servando Salina, MD  Reason for Follow Up: The patient is seen in follow up today for discussion of MRI results   History of present illness: [name] is a 45 y.o. G15P3 female with history of type 2 diabetes, gestational diabetes, hypertension, and abnormal uterine bleeding.  We initially met on 12/29/20 for discussion of [place] given presence of fibroids on prior (12/08/20) pelvic ultrasound.  MRI (01/04/21)showed a single small fibroid and adenomyosis.    I updated her on these findings, and briefly described the pathophysiology of adenomyosis and treatment options including hormonal therapy, [place], or hysterectomy.    Past Medical History:  Diagnosis Date  . Diabetes mellitus without complication (HCC)    gestational  . Gestational diabetes   . Postpartum care following cesarean delivery Indication: Repeat, SROM (12/11) 07/17/2016  . Pregnancy induced hypertension     Past Surgical History:  Procedure Laterality Date  . BREAST LUMPECTOMY    . BREAST SURGERY    . CESAREAN SECTION    . CESAREAN SECTION N/A 07/17/2016   Procedure: CESAREAN SECTION;  Surgeon: Servando Salina, MD;  Location: [facility];  Service: Obstetrics;  Laterality: N/A;  . CESAREAN SECTION WITH BILATERAL TUBAL LIGATION N/A 08/03/2018   Procedure: Repeat CESAREAN SECTION;  Surgeon: Servando Salina, MD;  Location: [facility];  Service: Obstetrics;  Laterality: N/A;  EDD: 08/23/18 Allergy: Codeine  . IR RADIOLOGIST EVAL & MGMT  12/29/2020    Allergies: Codeine  Medications: Prior to Admission medications   Medication Sig Start Date End Date Taking? Authorizing Provider  ferrous sulfate 325 (65 FE) MG tablet Take 1 tablet (325 mg total) by mouth daily. 08/06/18 08/06/19  Darliss Cheney, CNM  HYDROmorphone (DILAUDID) 4 MG tablet Take 1 tablet (4 mg total) by mouth every 4 (four) hours as needed for severe pain. 08/06/18   Sigmon, Tyler Deis, CNM  ibuprofen  (ADVIL,MOTRIN) 800 MG tablet Take 1 tablet (800 mg total) by mouth every 8 (eight) hours. 08/06/18   Darliss Cheney, CNM  metFORMIN (GLUCOPHAGE) 500 MG tablet Take 1 tablet (500 mg total) by mouth 2 (two) times daily with a meal. 08/06/18   Sigmon, Tyler Deis, CNM  NIFEdipine (ADALAT CC) 60 MG 24 hr tablet Take 1 tablet (60 mg total) by mouth daily. 07/21/18   Servando Salina, MD  Prenatal MV-Min-FA-Omega-3 (PRENATAL GUMMIES/DHA & FA) 0.4-32.5 MG CHEW Chew 2 each by mouth daily.    [provider]  senna-docusate (SENOKOT-S) 8.6-50 MG tablet Take 2 tablets by mouth daily. 08/07/18   Darliss Cheney, CNM     Family History  Problem Relation Age of Onset  . Diabetes Mother   . Hypertension Mother   . Diabetes Sister   . Diabetes Brother   . Heart disease Brother     Social History   Socioeconomic History  . Marital status: Married    Spouse name: Not on file  . Number of children: Not on file  . Years of education: Not on file  . Highest education level: Not on file  Occupational History  . Occupation: Print production planner  Tobacco Use  . Smoking status: Never Smoker  . Smokeless tobacco: Never Used  Vaping Use  . Vaping Use: Never used  Substance and Sexual Activity  . Alcohol use: Not Currently    Comment: ocassional   . Drug use: No  . Sexual activity: Yes    Birth control/protection: None  Other Topics Concern  . Not  on file  Social History Narrative  . Not on file   Social Determinants of Health   Financial Resource Strain: Not on file  Food Insecurity: Not on file  Transportation Needs: Not on file  Physical Activity: Not on file  Stress: Not on file  Social Connections: Not on file     Vital Signs: There were no vitals taken for this visit.  No physical examination performed in lieu of telephone visit.   Imaging: MRI Pelvis (01/04/21)   Labs:  CBC: No results for input(s): WBC, HGB, HCT, PLT in the last 8760 hours.  COAGS: No  results for input(s): INR, APTT in the last 8760 hours.  BMP: No results for input(s): NA, K, CL, CO2, GLUCOSE, BUN, CALCIUM, CREATININE, GFRNONAA, GFRAA in the last 8760 hours.  Invalid input(s): CMP  LIVER FUNCTION TESTS: No results for input(s): BILITOT, AST, ALT, ALKPHOS, PROT, ALBUMIN in the last 8760 hours.  Assessment and Plan: [name] is a 45 y.o. G53P3 female with history of type 2 diabetes, gestational diabetes, hypertension, and abnormal uterine bleeding.  We initially met on 12/29/20 for discussion of [place] given presence of fibroids on prior (12/08/20) pelvic ultrasound.  MRI (01/04/21)showed a single small fibroid and adenomyosis.  I have explained this diagnosis and how [place] is an acceptable, safe, and effective treatment option in addition to hormonal therapy and hysterectomy.  I provided UptoDate patient educational info for her review.  She would like more time to learn about her diagnosis of adenomyosis on her own, and then will call our office back if she decides to pursue [place] as treatment, or has any further questions.    Electronically Signed: Rosanne Ashing Rawlins Stuard 01/11/2021, 10:37 AM   I spent a total of 15 Minutes in face to face in clinical consultation, greater than 50% of which was counseling/coordinating care for adenomyosis.

## 2021-01-21 ENCOUNTER — Other Ambulatory Visit (HOSPITAL_COMMUNITY): Payer: Self-pay | Admitting: Interventional Radiology

## 2021-01-21 DIAGNOSIS — D259 Leiomyoma of uterus, unspecified: Secondary | ICD-10-CM

## 2021-02-21 ENCOUNTER — Other Ambulatory Visit: Payer: Self-pay | Admitting: Radiology

## 2021-02-21 ENCOUNTER — Other Ambulatory Visit (HOSPITAL_COMMUNITY)
Admission: RE | Admit: 2021-02-21 | Discharge: 2021-02-21 | Disposition: A | Discharge status: Home / Self Care | Payer: BC Managed Care – PPO | Source: Ambulatory Visit | Attending: Interventional Radiology | Admitting: Interventional Radiology

## 2021-02-21 DIAGNOSIS — Z01812 Encounter for preprocedural laboratory examination: Secondary | ICD-10-CM | POA: Insufficient documentation

## 2021-02-21 DIAGNOSIS — Z20822 Contact with and (suspected) exposure to covid-19: Secondary | ICD-10-CM | POA: Insufficient documentation

## 2021-02-21 LAB — SARS CORONAVIRUS 2 (TAT 6-24 HRS): SARS Coronavirus 2: NEGATIVE

## 2021-02-22 ENCOUNTER — Other Ambulatory Visit (HOSPITAL_COMMUNITY): Payer: Self-pay | Admitting: Interventional Radiology

## 2021-02-22 ENCOUNTER — Other Ambulatory Visit: Payer: Self-pay

## 2021-02-22 ENCOUNTER — Ambulatory Visit (HOSPITAL_COMMUNITY)
Admission: RE | Admit: 2021-02-22 | Discharge: 2021-02-22 | Disposition: A | Discharge status: Home / Self Care | Payer: BC Managed Care – PPO | Source: Ambulatory Visit | Attending: Interventional Radiology | Admitting: Interventional Radiology

## 2021-02-22 ENCOUNTER — Encounter (HOSPITAL_COMMUNITY): Payer: Self-pay

## 2021-02-22 DIAGNOSIS — Z7984 Long term (current) use of oral hypoglycemic drugs: Secondary | ICD-10-CM | POA: Insufficient documentation

## 2021-02-22 DIAGNOSIS — Z885 Allergy status to narcotic agent status: Secondary | ICD-10-CM | POA: Insufficient documentation

## 2021-02-22 DIAGNOSIS — E119 Type 2 diabetes mellitus without complications: Secondary | ICD-10-CM | POA: Insufficient documentation

## 2021-02-22 DIAGNOSIS — Z79899 Other long term (current) drug therapy: Secondary | ICD-10-CM | POA: Diagnosis not present

## 2021-02-22 DIAGNOSIS — D259 Leiomyoma of uterus, unspecified: Secondary | ICD-10-CM

## 2021-02-22 DIAGNOSIS — I10 Essential (primary) hypertension: Secondary | ICD-10-CM | POA: Diagnosis not present

## 2021-02-22 HISTORY — PX: IR ANGIOGRAM PELVIS SELECTIVE OR SUPRASELECTIVE: IMG661

## 2021-02-22 HISTORY — PX: IR US GUIDE VASC ACCESS LEFT: IMG2389

## 2021-02-22 HISTORY — PX: IR ANGIOGRAM SELECTIVE EACH ADDITIONAL VESSEL: IMG667

## 2021-02-22 HISTORY — PX: IR EMBO TUMOR ORGAN ISCHEMIA INFARCT INC GUIDE ROADMAPPING: IMG5449

## 2021-02-22 LAB — CBC
HCT: 36.7 % (ref 36.0–46.0)
Hemoglobin: 11.6 g/dL — ABNORMAL LOW (ref 12.0–15.0)
MCH: 25.8 pg — ABNORMAL LOW (ref 26.0–34.0)
MCHC: 31.6 g/dL (ref 30.0–36.0)
MCV: 81.6 fL (ref 80.0–100.0)
Platelets: 288 10*3/uL (ref 150–400)
RBC: 4.5 MIL/uL (ref 3.87–5.11)
RDW: 15.1 % (ref 11.5–15.5)
WBC: 6.2 10*3/uL (ref 4.0–10.5)
nRBC: 0 % (ref 0.0–0.2)

## 2021-02-22 LAB — HCG, SERUM, QUALITATIVE: Preg, Serum: NEGATIVE

## 2021-02-22 LAB — BASIC METABOLIC PANEL
Anion gap: 8 (ref 5–15)
BUN: 13 mg/dL (ref 6–20)
CO2: 25 mmol/L (ref 22–32)
Calcium: 9 mg/dL (ref 8.9–10.3)
Chloride: 107 mmol/L (ref 98–111)
Creatinine, Ser: 0.6 mg/dL (ref 0.44–1.00)
GFR, Estimated: >60 mL/min (ref >60)
Glucose, Bld: 129 mg/dL — ABNORMAL HIGH (ref 70–99)
Potassium: 4 mmol/L (ref 3.5–5.1)
Sodium: 140 mmol/L (ref 135–145)

## 2021-02-22 LAB — PROTIME-INR
INR: 1 (ref 0.8–1.2)
Prothrombin Time: 12.8 seconds (ref 11.4–15.2)

## 2021-02-22 LAB — TYPE AND SCREEN
ABO/RH(D): A POS
Antibody Screen: NEGATIVE

## 2021-02-22 LAB — GLUCOSE, CAPILLARY: Glucose-Capillary: 124 mg/dL — ABNORMAL HIGH (ref 70–99)

## 2021-02-22 LAB — APTT: aPTT: 26 seconds (ref 24–36)

## 2021-02-22 MED ORDER — KETOROLAC TROMETHAMINE 15 MG/ML IJ SOLN
15.0000 mg | Freq: Four times a day (QID) | INTRAMUSCULAR | Status: DC
  Administered 2021-02-22: 15 mg via INTRAVENOUS
  Filled 2021-02-22 (×2): qty 1

## 2021-02-22 MED ORDER — HYDRALAZINE HCL 20 MG/ML IJ SOLN
INTRAMUSCULAR | Status: AC
  Filled 2021-02-22: qty 1

## 2021-02-22 MED ORDER — KETOROLAC TROMETHAMINE 30 MG/ML IJ SOLN
INTRAMUSCULAR | Status: AC | PRN
  Administered 2021-02-22: 30 mg via INTRAVENOUS

## 2021-02-22 MED ORDER — HYDRALAZINE HCL 20 MG/ML IJ SOLN
INTRAMUSCULAR | Status: AC | PRN
  Administered 2021-02-22 (×2): 10 mg via INTRAVENOUS

## 2021-02-22 MED ORDER — OXYCODONE HCL 5 MG PO TABS
ORAL_TABLET | ORAL | Status: AC
  Filled 2021-02-22: qty 10

## 2021-02-22 MED ORDER — FENTANYL CITRATE (PF) 100 MCG/2ML IJ SOLN
INTRAMUSCULAR | Status: AC | PRN
  Administered 2021-02-22 (×2): 50 ug via INTRAVENOUS
  Administered 2021-02-22: 25 ug via INTRAVENOUS
  Administered 2021-02-22: 50 ug via INTRAVENOUS
  Administered 2021-02-22: 25 ug via INTRAVENOUS

## 2021-02-22 MED ORDER — OXYCODONE-ACETAMINOPHEN 7.5-325 MG PO TABS: 1.0000 | ORAL_TABLET | ORAL | 0 refills | Status: DC | PRN

## 2021-02-22 MED ORDER — DIPHENHYDRAMINE HCL 50 MG/ML IJ SOLN
INTRAMUSCULAR | Status: AC
  Filled 2021-02-22: qty 1

## 2021-02-22 MED ORDER — SODIUM CHLORIDE 0.9 % IV SOLN: 2.0000 g | Freq: Once | INTRAVENOUS | Status: AC

## 2021-02-22 MED ORDER — IBUPROFEN 200 MG PO TABS: 200.0000 mg | ORAL_TABLET | Freq: Four times a day (QID) | ORAL | 0 refills | Status: DC | PRN

## 2021-02-22 MED ORDER — KETOROLAC TROMETHAMINE 60 MG/2ML IM SOLN
INTRAMUSCULAR | Status: AC | PRN
  Administered 2021-02-22: 30 mg via INTRAMUSCULAR

## 2021-02-22 MED ORDER — OXYCODONE HCL 5 MG PO TABS
5.0000 mg | ORAL_TABLET | ORAL | Status: DC | PRN
  Administered 2021-02-22: 5 mg via ORAL

## 2021-02-22 MED ORDER — HYDROMORPHONE HCL 1 MG/ML IJ SOLN
INTRAMUSCULAR | Status: AC | PRN
  Administered 2021-02-22: 0.5 mg via INTRAVENOUS

## 2021-02-22 MED ORDER — MIDAZOLAM HCL 2 MG/2ML IJ SOLN
INTRAMUSCULAR | Status: AC | PRN
  Administered 2021-02-22 (×6): 1 mg via INTRAVENOUS

## 2021-02-22 MED ORDER — NITROGLYCERIN 1 MG/10 ML FOR IR/CATH LAB
INTRA_ARTERIAL | Status: AC | PRN
  Administered 2021-02-22 (×2): 2 mL via INTRA_ARTERIAL

## 2021-02-22 MED ORDER — PROMETHAZINE HCL 12.5 MG PO TABS: 12.5000 mg | ORAL_TABLET | ORAL | 0 refills | Status: DC | PRN

## 2021-02-22 MED ORDER — ACETAMINOPHEN 500 MG PO TABS: 1000.0000 mg | ORAL_TABLET | Freq: Four times a day (QID) | ORAL | Status: DC

## 2021-02-22 MED ORDER — IOHEXOL 300 MG/ML  SOLN
200.0000 mL | Freq: Once | INTRAMUSCULAR | Status: AC | PRN
  Administered 2021-02-22: 140 mL via INTRA_ARTERIAL

## 2021-02-22 MED ORDER — SODIUM CHLORIDE 0.9 % IV SOLN: INTRAVENOUS | Status: DC

## 2021-02-22 MED ORDER — HEPARIN SODIUM (PORCINE) 1000 UNIT/ML IJ SOLN
INTRAMUSCULAR | Status: AC | PRN
  Administered 2021-02-22: 3 mL via INTRA_ARTERIAL

## 2021-02-22 MED ORDER — ONDANSETRON HCL 4 MG/2ML IJ SOLN
4.0000 mg | Freq: Four times a day (QID) | INTRAMUSCULAR | Status: DC | PRN
  Administered 2021-02-22: 4 mg via INTRAVENOUS
  Filled 2021-02-22: qty 2

## 2021-02-22 MED ORDER — KETOROLAC TROMETHAMINE 10 MG PO TABS: 10.0000 mg | ORAL_TABLET | Freq: Four times a day (QID) | ORAL | 0 refills | Status: AC

## 2021-02-22 MED ORDER — MIDAZOLAM HCL 2 MG/2ML IJ SOLN
INTRAMUSCULAR | Status: AC
  Filled 2021-02-22: qty 6

## 2021-02-22 MED ORDER — FENTANYL CITRATE (PF) 100 MCG/2ML IJ SOLN
INTRAMUSCULAR | Status: AC
  Filled 2021-02-22: qty 4

## 2021-02-22 MED ORDER — OXYCODONE HCL ER 10 MG PO T12A
10.0000 mg | EXTENDED_RELEASE_TABLET | ORAL | Status: AC
Start: 2021-02-22 — End: 2021-02-22
  Administered 2021-02-22: 10 mg via ORAL
  Filled 2021-02-22: qty 1

## 2021-02-22 MED ORDER — IBUPROFEN 800 MG PO TABS: 800.0000 mg | ORAL_TABLET | Freq: Three times a day (TID) | ORAL | 0 refills | Status: AC

## 2021-02-22 MED ORDER — ACETAMINOPHEN 500 MG PO TABS
ORAL_TABLET | ORAL | Status: AC
  Filled 2021-02-22: qty 2

## 2021-02-22 MED ORDER — DEXAMETHASONE SODIUM PHOSPHATE 10 MG/ML IJ SOLN
INTRAMUSCULAR | Status: AC
  Administered 2021-02-22: 10 mg via INTRAVENOUS
  Filled 2021-02-22: qty 1

## 2021-02-22 MED ORDER — HEPARIN SODIUM (PORCINE) 1000 UNIT/ML IJ SOLN
INTRAMUSCULAR | Status: AC
  Filled 2021-02-22: qty 1

## 2021-02-22 MED ORDER — LIDOCAINE HCL (PF) 1 % IJ SOLN
INTRAMUSCULAR | Status: AC | PRN
  Administered 2021-02-22: 3 mL

## 2021-02-22 MED ORDER — NITROGLYCERIN IN D5W 100-5 MCG/ML-% IV SOLN
INTRAVENOUS | Status: AC
  Filled 2021-02-22: qty 250

## 2021-02-22 MED ORDER — VERAPAMIL HCL 2.5 MG/ML IV SOLN
INTRAVENOUS | Status: AC | PRN
  Administered 2021-02-22 (×2): 2.5 mg via INTRA_ARTERIAL

## 2021-02-22 MED ORDER — SODIUM CHLORIDE 0.9 % IV SOLN
INTRAVENOUS | Status: AC
  Administered 2021-02-22: 2 g via INTRAVENOUS
  Filled 2021-02-22: qty 2

## 2021-02-22 MED ORDER — SODIUM CHLORIDE 0.9 % IV SOLN
8.0000 mg | Freq: Once | INTRAVENOUS | Status: AC
  Administered 2021-02-22: 8 mg via INTRAVENOUS
  Filled 2021-02-22: qty 4

## 2021-02-22 MED ORDER — VERAPAMIL HCL 2.5 MG/ML IV SOLN
INTRAVENOUS | Status: AC
  Filled 2021-02-22: qty 2

## 2021-02-22 MED ORDER — OXYCODONE HCL 5 MG PO TABS: 10.0000 mg | ORAL_TABLET | ORAL | Status: DC | PRN

## 2021-02-22 MED ORDER — LIDOCAINE HCL (PF) 1 % IJ SOLN
INTRAMUSCULAR | Status: AC
  Filled 2021-02-22: qty 30

## 2021-02-22 MED ORDER — HYDROMORPHONE HCL 1 MG/ML IJ SOLN
INTRAMUSCULAR | Status: AC
  Filled 2021-02-22: qty 1

## 2021-02-22 MED ORDER — FENTANYL CITRATE (PF) 100 MCG/2ML IJ SOLN
25.0000 ug | INTRAMUSCULAR | Status: DC | PRN
Start: 2021-02-22 — End: 2021-02-23

## 2021-02-22 MED ORDER — DOCUSATE SODIUM 100 MG PO CAPS: 100.0000 mg | ORAL_CAPSULE | Freq: Two times a day (BID) | ORAL | 0 refills | Status: DC

## 2021-02-22 MED ORDER — ACETAMINOPHEN 500 MG PO TABS
1000.0000 mg | ORAL_TABLET | ORAL | Status: AC
  Administered 2021-02-22: 1000 mg via ORAL
  Filled 2021-02-22: qty 2

## 2021-02-22 MED ORDER — DEXAMETHASONE SODIUM PHOSPHATE 10 MG/ML IJ SOLN
10.0000 mg | Freq: Once | INTRAMUSCULAR | Status: AC
  Filled 2021-02-22: qty 1

## 2021-02-22 MED ORDER — KETOROLAC TROMETHAMINE 30 MG/ML IJ SOLN
INTRAMUSCULAR | Status: AC
  Filled 2021-02-22: qty 2

## 2021-02-22 NOTE — H&P (Signed)
Chief Complaint: Patient was seen in consultation today for bilateral uterine artery angiogram and embolization at the request of Servando Salina, MD  Referring Physician(s): Servando Salina, MD  Supervising Physician: Ruthann Cancer  Patient Status: [facility] - Out-pt  History of Present Illness: [name] is a 45 y.o. female PMH of DM type II, HTN, abnormal uterine bleeding who was referred to Dr. Serafina Royals for possible uterine artery embolization.  Patient is familiar to our service from consultation with Dr. Serafina Royals on 12/29/20 to discuss treatment options for symptomatic uterine fibroids. She was deemed an appropriate candidate for bilateral uterine artery embolization and presents today for the procedure.   Patient laying in bed, not in acute distress.  Reports that she has been spotting since June 19.  Denise headache, fever, chills, shortness of breath, cough, chest pain, abdominal pain, nausea ,vomiting, and bleeding.   Past Medical History:  Diagnosis Date   Diabetes mellitus without complication ([place])    gestational   Gestational diabetes    Postpartum care following cesarean delivery Indication: Repeat, SROM (12/11) 07/17/2016   Pregnancy induced hypertension     Past Surgical History:  Procedure Laterality Date   BREAST LUMPECTOMY     BREAST SURGERY     CESAREAN SECTION     CESAREAN SECTION N/A 07/17/2016   Procedure: CESAREAN SECTION;  Surgeon: Servando Salina, MD;  Location: [facility];  Service: Obstetrics;  Laterality: N/A;   CESAREAN SECTION WITH BILATERAL TUBAL LIGATION N/A 08/03/2018   Procedure: Repeat CESAREAN SECTION;  Surgeon: Servando Salina, MD;  Location: [facility];  Service: Obstetrics;  Laterality: N/A;  EDD: 08/23/18 Allergy: Codeine   IR RADIOLOGIST EVAL & MGMT  12/29/2020   IR RADIOLOGIST EVAL & MGMT  01/11/2021    Allergies: Codeine  Medications: Prior to Admission medications   Medication Sig Start Date End Date  Taking? Authorizing Provider  ferrous sulfate 325 (65 FE) MG tablet Take 1 tablet (325 mg total) by mouth daily. 08/06/18 08/06/19  Darliss Cheney, CNM  HYDROmorphone (DILAUDID) 4 MG tablet Take 1 tablet (4 mg total) by mouth every 4 (four) hours as needed for severe pain. 08/06/18   Sigmon, Tyler Deis, CNM  ibuprofen (ADVIL,MOTRIN) 800 MG tablet Take 1 tablet (800 mg total) by mouth every 8 (eight) hours. 08/06/18   Darliss Cheney, CNM  metFORMIN (GLUCOPHAGE) 500 MG tablet Take 1 tablet (500 mg total) by mouth 2 (two) times daily with a meal. 08/06/18   Sigmon, Tyler Deis, CNM  NIFEdipine (ADALAT CC) 60 MG 24 hr tablet Take 1 tablet (60 mg total) by mouth daily. 07/21/18   Servando Salina, MD  Prenatal MV-Min-FA-Omega-3 (PRENATAL GUMMIES/DHA & FA) 0.4-32.5 MG CHEW Chew 2 each by mouth daily.    [provider]  senna-docusate (SENOKOT-S) 8.6-50 MG tablet Take 2 tablets by mouth daily. 08/07/18   Darliss Cheney, CNM     Family History  Problem Relation Age of Onset   Diabetes Mother    Hypertension Mother    Diabetes Sister    Diabetes Brother    Heart disease Brother     Social History   Socioeconomic History   Marital status: Married    Spouse name: Not on file   Number of children: Not on file   Years of education: Not on file   Highest education level: Not on file  Occupational History   Occupation: preschool teacher  Tobacco Use   Smoking status: Never   Smokeless tobacco:  Never  Vaping Use   Vaping Use: Never used  Substance and Sexual Activity   Alcohol use: Not Currently    Comment: ocassional    Drug use: No   Sexual activity: Yes    Birth control/protection: None  Other Topics Concern   Not on file  Social History Narrative   Not on file   Social Determinants of Health   Financial Resource Strain: Not on file  Food Insecurity: Not on file  Transportation Needs: Not on file  Physical Activity: Not on file  Stress: Not on file  Social  Connections: Not on file     Review of Systems: A 12 point ROS discussed and pertinent positives are indicated in the HPI above.  All other systems are negative.   Vital Signs: BP (!) 155/99   Pulse (!) 59   Temp 98.2 F (36.8 C) (Oral)   Resp 18   Ht 5\' 6"  (1.676 m)   Wt 211 lb (95.7 kg)   LMP  (LMP Unknown) Comment: Spotting since June 9th 2022  SpO2 100%   BMI 34.06 kg/m   Physical Exam  Vitals and nursing note reviewed.  Constitutional:      General: He is not in acute distress.    Appearance: Normal appearance.  HENT:     Head: Normocephalic and atraumatic.     Mouth/Throat:     Mouth: Mucous membranes are moist.     Pharynx: Oropharynx is clear.  Cardiovascular:     Rate and Rhythm: Normal rate and regular rhythm.     Pulses: Normal pulses.     Heart sounds: Normal heart sounds.  Pulmonary:     Effort: Pulmonary effort is normal.     Breath sounds: Normal breath sounds. No wheezing, rhonchi or rales.  Abdominal:     General: Bowel sounds are normal. There is no distension.     Palpations: Abdomen is soft.  Skin:    General: Skin is warm and dry.  Neurological:     Mental Status: He is alert and oriented to person, place, and time.  Psychiatric:        Mood and Affect: Mood normal.        Behavior: Behavior normal.    MD Evaluation Airway: WNL Heart: WNL Abdomen: WNL Chest/ Lungs: WNL ASA  Classification: 2 Mallampati/Airway Score: Two  Imaging: No results found.  Labs:  CBC: Recent Labs    02/22/21 0848  WBC 6.2  HGB 11.6*  HCT 36.7  PLT 288    COAGS: Recent Labs    02/22/21 0848  INR 1.0  APTT 26    BMP: No results for input(s): NA, K, CL, CO2, GLUCOSE, BUN, CALCIUM, CREATININE, GFRNONAA, GFRAA in the last 8760 hours.  Invalid input(s): CMP  LIVER FUNCTION TESTS: No results for input(s): BILITOT, AST, ALT, ALKPHOS, PROT, ALBUMIN in the last 8760 hours.  TUMOR MARKERS: No results for input(s): AFPTM, CEA, CA199, CHROMGRNA  in the last 8760 hours.  Assessment and Plan: 45 y.o. female with symptomatic uterine fibroids.  Patient had a consultation visit with Dr. Serafina Royals on 12/29/2020, was deemed a appropriate candidate for bilateral uterine artery embolization.  Patient [facility] to [facility] IR today for the procedure. N.p.o. since midnight VSS, HTN at baseline 155/99  CBC mild anemia, 11.6 plt 288 BMP pending  INR 1.0  hCG pending Not on anticoagulation antiplatelet treatment  Risks and benefits of procedure were discussed with the patient including, but not limited to bleeding, infection,  vascular injury or contrast induced renal failure.   This interventional procedure involves the use of X-rays and because of the nature of the planned procedure, it is possible that we will have prolonged use of X-ray fluoroscopy.   Potential radiation risks to you include (but are not limited to) the following: - A slightly elevated risk for cancer several years later in life. This risk is typically less than 0.5% percent. This risk is low in comparison to the normal incidence of human cancer, which is 33% for women and 50% for men according to the [employer].  - Radiation induced injury can include skin redness, resembling a rash, tissue breakdown / ulcers and hair loss (which can be temporary or permanent).    The likelihood of either of these occurring depends on the difficulty of the procedure and whether you are sensitive to radiation due to previous procedures, disease, or genetic conditions.    IF your procedure requires a prolonged use of radiation, you will be notified and given written instructions for further action.  It is your responsibility to monitor the irradiated area for the 2 weeks following the procedure and to notify your physician if you are concerned that you have suffered a radiation induced injury.     All of the patient's questions were answered, patient is agreeable to proceed.   Consent  signed and in chart.    Thank you for this interesting consult.  I greatly enjoyed meeting BELISSA KOOY and look forward to participating in their care.  A copy of this report was sent to the requesting provider on this date.  Electronically Signed: Tera Mater, PA-C 02/22/2021, 9:20 AM   I spent a total of    25 Minutes in face to face in clinical consultation, greater than 50% of which was counseling/coordinating care for bilateral uterine artery angiogram and embolization

## 2021-02-22 NOTE — Procedures (Signed)
Interventional Radiology Procedure Note  Procedure: Uterine artery embolization  Findings: Please refer to procedural dictation for full description. Bilateral uterine artery embolization from left radial artery approach. 500-700 micron embospheres (1 vial left, 1.5 vials right).  Complications: None immediate  Estimated Blood Loss: < 5 mL  Recommendations: -Short stay recovery, plan for discharge home today if pain well controlled -Remove Foley catheter -Advance diet as tolerated -1 hour bedrest then ambulate as tolerated    Ruthann Cancer, MD Pager: [phone]

## 2021-02-22 NOTE — Discharge Instructions (Signed)
Please call Interventional Radiology clinic [phone] with any questions or concerns.  You may remove your dressing and shower tomorrow.     Uterine Artery Embolization for Fibroids, Care After This sheet gives you information about how to care for yourself after your procedure. Your health care provider may also give you more specific instructions. If you have problems or questions, contact your health careprovider. What can I expect after the procedure? After your procedure, it is common to have: Pelvic cramping. You will be given pain medicine. Nausea and vomiting. You may be given medicine to help relieve nausea. Follow these instructions at home: Incision care Follow instructions from your health care provider about how to take care of your incision. Make sure you: Wash your hands with soap and water before you change your bandage (dressing). If soap and water are not available, use hand sanitizer. Change your dressing as told by your health care provider. Check your incision area every day for signs of infection. Check for: More redness, swelling, or pain. More fluid or blood. Warmth. Pus or a bad smell. Medicines  Take over-the-counter and prescription medicines only as told by your health care provider. Do not take aspirin. It can cause bleeding. Do not drive for 24 hours if you were given a medicine to help you relax (sedative). Do not drive or use heavy machinery while taking prescription pain medicine.   General instructions Ask your health care provider when you can resume sexual activity. To prevent or treat constipation while you are taking prescription pain medicine, your health care provider may recommend that you: Drink enough fluid to keep your urine clear or pale yellow. Take over-the-counter or prescription medicines. Eat foods that are high in fiber, such as fresh fruits and vegetables, whole grains, and beans. Limit foods that are high in fat and  processed sugars, such as fried and sweet foods. Contact a health care provider if: You have a fever. You have more redness, swelling, or pain around your incision site. You have more fluid or blood coming from your incision site. Your incision feels warm to the touch. You have pus or a bad smell coming from your incision. You have a rash. You have uncontrolled nausea or you cannot eat or drink anything without vomiting. Get help right away if: You have trouble breathing. You have chest pain. You have severe abdominal pain. You have leg pain. You become dizzy and faint. Summary After your procedure, it is common to have pelvic cramping. You will be given pain medicine. Follow instructions from your health care provider about how to take care of your incision. Check your incision area every day for signs of infection. Take over-the-counter and prescription medicines only as told by your health care provider. This information is not intended to replace advice given to you by your health care provider. Make sure you discuss any questions you have with your healthcare provider. Document Revised: 07/06/2017 Document Reviewed: 10/26/2016 Elsevier Patient Education  2021 [employer].    Moderate Conscious Sedation, Adult, Care After This sheet gives you information about how to care for yourself after your procedure. Your health care provider may also give you more specific instructions. If you have problems or questions, contact your health care provider. What can I expect after the procedure? After the procedure, it is common to have: Sleepiness for several hours. Impaired judgment for several hours. Difficulty with balance. Vomiting if you eat too soon. Follow these instructions at home: For the time period  you were told by your health care provider: Rest. Do not participate in activities where you could fall or become injured. Do not drive or use machinery. Do not drink  alcohol. Do not take sleeping pills or medicines that cause drowsiness. Do not make important decisions or sign legal documents. Do not take care of children on your own.        Eating and drinking Follow the diet recommended by your health care provider. Drink enough fluid to keep your urine pale yellow. If you vomit: Drink water, juice, or soup when you can drink without vomiting. Make sure you have little or no nausea before eating solid foods.    General instructions Take over-the-counter and prescription medicines only as told by your health care provider. Have a responsible adult stay with you for the time you are told. It is important to have someone help care for you until you are awake and alert. Do not smoke. Keep all follow-up visits as told by your health care provider. This is important. Contact a health care provider if: You are still sleepy or having trouble with balance after 24 hours. You feel light-headed. You keep feeling nauseous or you keep vomiting. You develop a rash. You have a fever. You have redness or swelling around the IV site. Get help right away if: You have trouble breathing. You have new-onset confusion at home. Summary After the procedure, it is common to feel sleepy, have impaired judgment, or feel nauseous if you eat too soon. Rest after you get home. Know the things you should not do after the procedure. Follow the diet recommended by your health care provider and drink enough fluid to keep your urine pale yellow. Get help right away if you have trouble breathing or new-onset confusion at home. This information is not intended to replace advice given to you by your health care provider. Make sure you discuss any questions you have with your health care provider. Document Revised: 11/21/2019 Document Reviewed: 06/19/2019 Elsevier Patient Education  2021 [employer].

## 2021-03-04 ENCOUNTER — Other Ambulatory Visit: Payer: Self-pay | Admitting: Interventional Radiology

## 2021-03-04 DIAGNOSIS — D259 Leiomyoma of uterus, unspecified: Secondary | ICD-10-CM

## 2021-04-05 ENCOUNTER — Encounter: Payer: Self-pay | Admitting: *Deleted

## 2021-04-05 ENCOUNTER — Other Ambulatory Visit: Payer: Self-pay

## 2021-04-05 ENCOUNTER — Ambulatory Visit
Admission: RE | Admit: 2021-04-05 | Discharge: 2021-04-05 | Disposition: A | Discharge status: Home / Self Care | Payer: BC Managed Care – PPO | Source: Ambulatory Visit | Attending: Interventional Radiology | Admitting: Interventional Radiology

## 2021-04-05 DIAGNOSIS — D259 Leiomyoma of uterus, unspecified: Secondary | ICD-10-CM

## 2021-04-05 HISTORY — PX: IR RADIOLOGIST EVAL & MGMT: IMG5224

## 2021-04-05 NOTE — Progress Notes (Signed)
Referring Physician(s): Servando Salina, MD  Reason for follow up: 1 month status post uterine artery embolization via telephone virtual visit  History of present illness: 45 year old female with history of symptomatic uterine adenomyosis status post bilateral uterine artery embolization on 02/22/2021.  She has recovered very well since the procedure. She has had one menstrual cycle, which lasted 7-10 days and was much lighter with no associated pain.  Her previously described post-coital ache has resolved.  No new complaints.   Past Medical History:  Diagnosis Date   Diabetes mellitus without complication ([place])    gestational   Gestational diabetes    Postpartum care following cesarean delivery Indication: Repeat, SROM (12/11) 07/17/2016   Pregnancy induced hypertension     Past Surgical History:  Procedure Laterality Date   BREAST LUMPECTOMY     BREAST SURGERY     CESAREAN SECTION     CESAREAN SECTION N/A 07/17/2016   Procedure: CESAREAN SECTION;  Surgeon: Servando Salina, MD;  Location: [facility];  Service: Obstetrics;  Laterality: N/A;   CESAREAN SECTION WITH BILATERAL TUBAL LIGATION N/A 08/03/2018   Procedure: Repeat CESAREAN SECTION;  Surgeon: Servando Salina, MD;  Location: [facility];  Service: Obstetrics;  Laterality: N/A;  EDD: 08/23/18 Allergy: Codeine   IR ANGIOGRAM PELVIS SELECTIVE OR SUPRASELECTIVE  02/22/2021   IR ANGIOGRAM SELECTIVE EACH ADDITIONAL VESSEL  02/22/2021   IR ANGIOGRAM SELECTIVE EACH ADDITIONAL VESSEL  02/22/2021   IR EMBO TUMOR ORGAN ISCHEMIA INFARCT INC GUIDE ROADMAPPING  02/22/2021   IR RADIOLOGIST EVAL & MGMT  12/29/2020   IR RADIOLOGIST EVAL & MGMT  01/11/2021   IR US GUIDE VASC ACCESS LEFT  02/22/2021    Allergies: Codeine  Medications: Prior to Admission medications   Medication Sig Start Date End Date Taking? Authorizing Provider  docusate sodium (COLACE) 100 MG capsule Take 1 capsule (100 mg total) by mouth 2  (two) times daily. Take 1 capsule by mouth 2 times daily while using Percocet to prevent constipation. 02/22/21   Han, Aimee H, PA-C  ferrous sulfate 325 (65 FE) MG tablet Take 1 tablet (325 mg total) by mouth daily. 08/06/18 08/06/19  Darliss Cheney, CNM  HYDROmorphone (DILAUDID) 4 MG tablet Take 1 tablet (4 mg total) by mouth every 4 (four) hours as needed for severe pain. 08/06/18   Darliss Cheney, CNM  metFORMIN (GLUCOPHAGE) 500 MG tablet Take 1 tablet (500 mg total) by mouth 2 (two) times daily with a meal. 08/06/18   Sigmon, Tyler Deis, CNM  NIFEdipine (ADALAT CC) 60 MG 24 hr tablet Take 1 tablet (60 mg total) by mouth daily. 07/21/18   Servando Salina, MD  oxyCODONE-acetaminophen (PERCOCET) 7.5-325 MG tablet Take 1 tablet by mouth every 4 (four) hours as needed for severe pain. 02/22/21   Han, Aimee H, PA-C  Prenatal MV-Min-FA-Omega-3 (PRENATAL GUMMIES/DHA & FA) 0.4-32.5 MG CHEW Chew 2 each by mouth daily.    [provider]  promethazine (PHENERGAN) 12.5 MG tablet Take 1 tablet (12.5 mg total) by mouth every 4 (four) hours as needed for nausea or vomiting. 02/22/21   Han, Aimee H, PA-C  senna-docusate (SENOKOT-S) 8.6-50 MG tablet Take 2 tablets by mouth daily. 08/07/18   Darliss Cheney, CNM     Family History  Problem Relation Age of Onset   Diabetes Mother    Hypertension Mother    Diabetes Sister    Diabetes Brother    Heart disease Brother     Social History  Socioeconomic History   Marital status: Married    Spouse name: Not on file   Number of children: Not on file   Years of education: Not on file   Highest education level: Not on file  Occupational History   Occupation: preschool teacher  Tobacco Use   Smoking status: Never   Smokeless tobacco: Never  Vaping Use   Vaping Use: Never used  Substance and Sexual Activity   Alcohol use: Not Currently    Comment: ocassional    Drug use: No   Sexual activity: Yes    Birth control/protection: None   Other Topics Concern   Not on file  Social History Narrative   Not on file   Social Determinants of Health   Financial Resource Strain: Not on file  Food Insecurity: Not on file  Transportation Needs: Not on file  Physical Activity: Not on file  Stress: Not on file  Social Connections: Not on file     Vital Signs: There were no vitals taken for this visit.  No physical examination performed in lieu of telephone visit.  Imaging: [place] 02/22/21 Pre   Post     Labs:  CBC: Recent Labs    02/22/21 0848  WBC 6.2  HGB 11.6*  HCT 36.7  PLT 288    COAGS: Recent Labs    02/22/21 0848  INR 1.0  APTT 26    BMP: Recent Labs    02/22/21 0848  NA 140  K 4.0  CL 107  CO2 25  GLUCOSE 129*  BUN 13  CALCIUM 9.0  CREATININE 0.60  GFRNONAA >60    LIVER FUNCTION TESTS: No results for input(s): BILITOT, AST, ALT, ALKPHOS, PROT, ALBUMIN in the last 8760 hours.  Assessment and Plan: 45 year old G25P3 female with history of uterine adenomyosis complicated by abnormal uterine bleeding, now status post uterine artery embolization on 02/22/21.  Since the procedure she has recovered well with a single shorter and less heavy menstrual cycle and resolution of post-coital ache.  Plan to follow up in 6 months in IR clinic, no imaging needed at that time.    Electronically Signed: Rosanne Ashing Abimelec Grochowski 04/05/2021, 11:30 AM   I spent a total of 15 Minutes in telephone clinical consultation, greater than 50% of which was counseling/coordinating care for symptomatic uterine adenomyosis.

## 2021-07-07 ENCOUNTER — Encounter (HOSPITAL_BASED_OUTPATIENT_CLINIC_OR_DEPARTMENT_OTHER): Payer: Self-pay | Admitting: Cardiovascular Disease

## 2021-07-07 ENCOUNTER — Other Ambulatory Visit: Payer: Self-pay

## 2021-07-07 ENCOUNTER — Ambulatory Visit (INDEPENDENT_AMBULATORY_CARE_PROVIDER_SITE_OTHER): Payer: BC Managed Care – PPO | Admitting: Cardiovascular Disease

## 2021-07-07 VITALS — BP 178/104 | HR 66 | Ht 63.0 in | Wt 225.5 lb

## 2021-07-07 DIAGNOSIS — R0609 Other forms of dyspnea: Secondary | ICD-10-CM | POA: Diagnosis not present

## 2021-07-07 DIAGNOSIS — R079 Chest pain, unspecified: Secondary | ICD-10-CM | POA: Diagnosis not present

## 2021-07-07 DIAGNOSIS — I10 Essential (primary) hypertension: Secondary | ICD-10-CM

## 2021-07-07 HISTORY — DX: Other forms of dyspnea: R06.09

## 2021-07-07 HISTORY — DX: Essential (primary) hypertension: I10

## 2021-07-07 MED ORDER — METOPROLOL TARTRATE 100 MG PO TABS: ORAL_TABLET | ORAL | 0 refills | Status: DC

## 2021-07-07 MED ORDER — AMLODIPINE BESYLATE 5 MG PO TABS: 5.0000 mg | ORAL_TABLET | Freq: Every day | ORAL | 3 refills | Status: DC

## 2021-07-07 MED ORDER — HYDROCHLOROTHIAZIDE 25 MG PO TABS: 25.0000 mg | ORAL_TABLET | Freq: Every day | ORAL | 3 refills | Status: DC

## 2021-07-07 NOTE — Assessment & Plan Note (Addendum)
Previously gestational hypertension. Now she has essential hypertension.  We discussed lifestyle interventions.  Increase exercise to at least 150 minutes weekly.  She will limit sodium to 2500mg  daily.  Add HCTZ 25mg  daily and amlodipine 5mg .  Stop metoprolol.  Check BMP and TSH in a week.  She will track her BP and bring to follow up.

## 2021-07-07 NOTE — Patient Instructions (Signed)
Medication Instructions:  STOP METOPROLOL   START AMLODIPINE 5 MG DAILY   START HYDROCHLOROTHIAZIDE 25 MG DAILY   TAKE METOPROLOL TART 100 MG 2 HOURS PRIOR TO CARDIAC CT   Labwork: BMET/TSH IN 1 WEEK   Testing/Procedures: Your physician has requested that you have cardiac CT. Cardiac computed tomography (CT) is a painless test that uses an x-ray machine to take clear, detailed pictures of your heart. For further information please visit [email or web address]. Please follow instruction sheet as given. THE OFFICE WILL CALL YOU TO SCHEDULE ONCE YOUR INSURANCE HAS BEEN REVIEWED   Follow-Up: 08/10/2021 3:00 PM WITH PHARM D AT [facility] LOCATION   10/06/2021 2:30 PM WITH DR [place] AT [facility]   Any Other Special Instructions Will Be Listed Below (If Applicable).  MONITOR YOUR BLOOD PRESSURE TWICE A DAY, LOG IN THE BOOK PROVIDED. BRING THE BOOK AND YOUR BLOOD PRESSURE MACHINE TO YOUR FOLLOW UP IN 1 MONTH     Your cardiac CT will be scheduled at one of the below locations:   [facility] [address] [place], [place] [address] [phone]  [facility] [address] [place], [place] [address] [phone]  If scheduled at [facility], please arrive at the [facility] main entrance (entrance A) of [facility] 30 minutes prior to test start time. You can use the FREE valet parking offered at the main entrance (encouraged to control the heart rate for the test) Proceed to the [facility] Radiology Department (first floor) to check-in and test prep.  If scheduled at [facility], please arrive 15 mins early for check-in and test prep.  Please follow these instructions carefully (unless otherwise directed):  Hold all erectile dysfunction medications at least 3 days (72 hrs) prior to test.  On the Night Before the Test: Be sure to Drink plenty of water. Do not consume  any caffeinated/decaffeinated beverages or chocolate 12 hours prior to your test. Do not take any antihistamines 12 hours prior to your test. If the patient has contrast allergy: Patient will need a prescription for Prednisone and very clear instructions (as follows): Prednisone 50 mg - take 13 hours prior to test Take another Prednisone 50 mg 7 hours prior to test Take another Prednisone 50 mg 1 hour prior to test Take Benadryl 50 mg 1 hour prior to test Patient must complete all four doses of above prophylactic medications. Patient will need a ride after test due to Benadryl.  On the Day of the Test: Drink plenty of water until 1 hour prior to the test. Do not eat any food 4 hours prior to the test. You may take your regular medications prior to the test.  Take metoprolol (Lopressor) two hours prior to test. HOLD Furosemide/Hydrochlorothiazide morning of the test. FEMALES- please wear underwire-free bra if available, avoid dresses & tight clothing  After the Test: Drink plenty of water. After receiving IV contrast, you may experience a mild flushed feeling. This is normal. On occasion, you may experience a mild rash up to 24 hours after the test. This is not dangerous. If this occurs, you can take Benadryl 25 mg and increase your fluid intake. If you experience trouble breathing, this can be serious. If it is severe call 911 IMMEDIATELY. If it is mild, please call our office. If you take any of these medications: Glipizide/Metformin, Avandament, Glucavance, please do not take 48 hours after completing test unless otherwise instructed.  Please  allow 2-4 weeks for scheduling of routine cardiac CTs. Some insurance companies require a pre-authorization which may delay scheduling of this test.   For non-scheduling related questions, please contact the cardiac imaging nurse navigator should you have any questions/concerns: Marchia Bond, Cardiac Imaging Nurse Navigator Gordy Clement,  Cardiac Imaging Nurse Navigator [facility] Direct Office Dial: [phone]   For scheduling needs, including cancellations and rescheduling, please call [place], [phone].  Cardiac CT Angiogram A cardiac CT angiogram is a procedure to look at the heart and the area around the heart. It may be done to help find the cause of chest pains or other symptoms of heart disease. During this procedure, a substance called contrast dye is injected into the blood vessels in the area to be checked. A large X-ray machine, called a CT scanner, then takes detailed pictures of the heart and the surrounding area. The procedure is also sometimes called a coronary CT angiogram, coronary artery scanning, or CTA. A cardiac CT angiogram allows the health care provider to see how well blood is flowing to and from the heart. The health care provider will be able to see if there are any problems, such as: Blockage or narrowing of the coronary arteries in the heart. Fluid around the heart. Signs of weakness or disease in the muscles, valves, and tissues of the heart. Tell a health care provider about: Any allergies you have. This is especially important if you have had a previous allergic reaction to contrast dye. All medicines you are taking, including vitamins, herbs, eye drops, creams, and over-the-counter medicines. Any blood disorders you have. Any surgeries you have had. Any medical conditions you have. Whether you are pregnant or may be pregnant. Any anxiety disorders, chronic pain, or other conditions you have that may increase your stress or prevent you from lying still. What are the risks? Generally, this is a safe procedure. However, problems may occur, including: Bleeding. Infection. Allergic reactions to medicines or dyes. Damage to other structures or organs. Kidney damage from the contrast dye that is used. Increased risk of cancer from radiation exposure. This risk is  low. Talk with your health care provider about: The risks and benefits of testing. How you can receive the lowest dose of radiation. What happens before the procedure? Wear comfortable clothing and remove any jewelry, glasses, dentures, and hearing aids. Follow instructions from your health care provider about eating and drinking. This may include: For 12 hours before the procedure -- avoid caffeine. This includes tea, coffee, soda, energy drinks, and diet pills. Drink plenty of water or other fluids that do not have caffeine in them. Being well hydrated can prevent complications. For 4-6 hours before the procedure -- stop eating and drinking. The contrast dye can cause nausea, but this is less likely if your stomach is empty. Ask your health care provider about changing or stopping your regular medicines. This is especially important if you are taking diabetes medicines, blood thinners, or medicines to treat problems with erections (erectile dysfunction). What happens during the procedure?  Hair on your chest may need to be removed so that small sticky patches called electrodes can be placed on your chest. These will transmit information that helps to monitor your heart during the procedure. An IV will be inserted into one of your veins. You might be given a medicine to control your heart rate during the procedure. This will help to ensure that good images are obtained. You will be asked to  lie on an exam table. This table will slide in and out of the CT machine during the procedure. Contrast dye will be injected into the IV. You might feel warm, or you may get a metallic taste in your mouth. You will be given a medicine called nitroglycerin. This will relax or dilate the arteries in your heart. The table that you are lying on will move into the CT machine tunnel for the scan. The person running the machine will give you instructions while the scans are being done. You may be asked to: Keep your  arms above your head. Hold your breath. Stay very still, even if the table is moving. When the scanning is complete, you will be moved out of the machine. The IV will be removed. The procedure may vary among health care providers and hospitals. What can I expect after the procedure? After your procedure, it is common to have: A metallic taste in your mouth from the contrast dye. A feeling of warmth. A headache from the nitroglycerin. Follow these instructions at home: Take over-the-counter and prescription medicines only as told by your health care provider. If you are told, drink enough fluid to keep your urine pale yellow. This will help to flush the contrast dye out of your body. Most people can return to their normal activities right after the procedure. Ask your health care provider what activities are safe for you. It is up to you to get the results of your procedure. Ask your health care provider, or the department that is doing the procedure, when your results will be ready. Keep all follow-up visits as told by your health care provider. This is important. Contact a health care provider if: You have any symptoms of allergy to the contrast dye. These include: Shortness of breath. Rash or hives. A racing heartbeat. Summary A cardiac CT angiogram is a procedure to look at the heart and the area around the heart. It may be done to help find the cause of chest pains or other symptoms of heart disease. During this procedure, a large X-ray machine, called a CT scanner, takes detailed pictures of the heart and the surrounding area after a contrast dye has been injected into blood vessels in the area. Ask your health care provider about changing or stopping your regular medicines before the procedure. This is especially important if you are taking diabetes medicines, blood thinners, or medicines to treat erectile dysfunction. If you are told, drink enough fluid to keep your urine pale  yellow. This will help to flush the contrast dye out of your body. This information is not intended to replace advice given to you by your health care provider. Make sure you discuss any questions you have with your health care provider. Document Revised: 04/06/2021 Document Reviewed: 03/19/2019 Elsevier Patient Education  2022 [employer].

## 2021-07-07 NOTE — Progress Notes (Signed)
Cardiology Office Note:    Date:  07/07/2021   ID:  [name], DOB February 20, 1976, MRN [id number]  PCP:  Johna Roles, PA   [facility] Providers Cardiologist:  None     Referring MD: Servando Salina, MD   No chief complaint on file.   History of Present Illness:    [name] is a 45 y.o. female with a hx of pregnancy-induced hypertension, gestational diabetes,  here for the evaluation of chronic hypertension. She saw Dr. Garwin Brothers on 04/2021 and her BP was 163/108. She was started on metoprolol at the time.  Today, she reports having high blood pressure since the end of her pregnancy in 2019. However, she states she has not been dx with hypertension outside of pregnancy. Outside of work she does not formally exercise. She works as a Aeronautical engineer. Lately she has been more fatigued easily, and will be short of breath after climbing stairs or from walking to her car. At home she becomes short winded while caring for her children at 63 yo and 22 yo. She usually attributes this to her weight. Recently she has also noticed new chest pain that is relieved with rest. During the night she wakes up coughing (non-productive). She has been trying coricidin, but she does not believe this helps her. Usually she cooks her own meals, and will use pink Himalayan salt. She enjoys drinking green tea daily. Her alcohol consumption is occasional, maybe 3 times a month. She denies any palpitations. No lightheadedness, headaches, syncope, orthopnea, or lower extremity edema.  Previous antihypertensives: Nifedipine Amlodipine  Past Medical History:  Diagnosis Date   Diabetes mellitus without complication ([place])    gestational   Essential hypertension 07/07/2021   Exertional dyspnea 07/07/2021   Gestational diabetes    Postpartum care following cesarean delivery Indication: Repeat, SROM (12/11) 07/17/2016   Pregnancy induced hypertension     Past Surgical History:  Procedure Laterality  Date   BREAST LUMPECTOMY     BREAST SURGERY     CESAREAN SECTION     CESAREAN SECTION N/A 07/17/2016   Procedure: CESAREAN SECTION;  Surgeon: Servando Salina, MD;  Location: [place];  Service: Obstetrics;  Laterality: N/A;   CESAREAN SECTION WITH BILATERAL TUBAL LIGATION N/A 08/03/2018   Procedure: Repeat CESAREAN SECTION;  Surgeon: Servando Salina, MD;  Location: [place];  Service: Obstetrics;  Laterality: N/A;  EDD: 08/23/18 Allergy: Codeine   IR ANGIOGRAM PELVIS SELECTIVE OR SUPRASELECTIVE  02/22/2021   IR ANGIOGRAM SELECTIVE EACH ADDITIONAL VESSEL  02/22/2021   IR ANGIOGRAM SELECTIVE EACH ADDITIONAL VESSEL  02/22/2021   IR EMBO TUMOR ORGAN ISCHEMIA INFARCT INC GUIDE ROADMAPPING  02/22/2021   IR RADIOLOGIST EVAL & MGMT  12/29/2020   IR RADIOLOGIST EVAL & MGMT  01/11/2021   IR RADIOLOGIST EVAL & MGMT  04/05/2021   IR US GUIDE VASC ACCESS LEFT  02/22/2021    Current Medications: Current Meds  Medication Sig   amLODipine (NORVASC) 5 MG tablet Take 1 tablet (5 mg total) by mouth daily.   hydrochlorothiazide (HYDRODIURIL) 25 MG tablet Take 1 tablet (25 mg total) by mouth daily.   metoprolol tartrate (LOPRESSOR) 100 MG tablet Take 1 tablet 2 hours prior to ct   [DISCONTINUED] metoprolol succinate (TOPROL-XL) 50 MG 24 hr tablet Take 50 mg by mouth daily.     Allergies:   Codeine   Social History   Socioeconomic History   Marital status: Married    Spouse name: Not on file  Number of children: Not on file   Years of education: Not on file   Highest education level: Not on file  Occupational History   Occupation: preschool teacher  Tobacco Use   Smoking status: Never   Smokeless tobacco: Never  Vaping Use   Vaping Use: Never used  Substance and Sexual Activity   Alcohol use: Not Currently    Comment: ocassional    Drug use: No   Sexual activity: Yes    Birth control/protection: None  Other Topics Concern   Not on file  Social History Narrative   Not  on file   Social Determinants of Health   Financial Resource Strain: Low Risk    Difficulty of Paying Living Expenses: Not hard at all  Food Insecurity: No Food Insecurity   Worried About Charity fundraiser in the Last Year: Never true   [place] in the Last Year: Never true  Transportation Needs: No Transportation Needs   Lack of Transportation (Medical): No   Lack of Transportation (Non-Medical): No  Physical Activity: Inactive   Days of Exercise per Week: 0 days   Minutes of Exercise per Session: 0 min  Stress: Not on file  Social Connections: Not on file     Family History: The patient's family history includes Diabetes in her brother, mother, and sister; Heart disease in her brother; Hypertension in her mother.  ROS:   Please see the history of present illness.    (+) Fatigue (+) Shortness of breath (+) Chest pain (+) Nocturnal, non-productive cough All other systems reviewed and are negative.  EKGs/Labs/Other Studies Reviewed:    The following studies were reviewed today: No prior cardiovascular studies available.  EKG:    07/07/2021: Sinus rhythm. Rate 66 bpm.  Recent Labs: 02/22/2021: BUN 13; Creatinine, Ser 0.60; Hemoglobin 11.6; Platelets 288; Potassium 4.0; Sodium 140   Recent Lipid Panel    Component Value Date/Time   CHOL 186 09/27/2017 1554   TRIG 52 09/27/2017 1554   HDL 51 09/27/2017 1554   CHOLHDL 3.6 09/27/2017 1554   LDLCALC 125 (H) 09/27/2017 1554        Physical Exam:    Wt Readings from Last 3 Encounters:  07/07/21 225 lb 8 oz (102.3 kg)  02/22/21 211 lb (95.7 kg)  08/03/18 221 lb 14.4 oz (100.7 kg)    VS:  BP (!) 178/104 (BP Location: Right Arm, Patient Position: Sitting, Cuff Size: Large)   Pulse 66   Ht 5\' 3"  (1.6 m)   Wt 225 lb 8 oz (102.3 kg)   BMI 39.95 kg/m  , BMI Body mass index is 39.95 kg/m. GENERAL:  Well appearing HEENT: Pupils equal round and reactive, fundi not visualized, oral mucosa unremarkable NECK:  No  jugular venous distention, waveform within normal limits, carotid upstroke brisk and symmetric, no bruits, no thyromegaly LUNGS:  Clear to auscultation bilaterally HEART:  RRR.  PMI not displaced or sustained,S1 and S2 within normal limits, no S3, no S4, no clicks, no rubs, no murmurs ABD:  Flat, positive bowel sounds normal in frequency in pitch, no bruits, no rebound, no guarding, no midline pulsatile mass, no hepatomegaly, no splenomegaly EXT:  2 plus pulses throughout, no edema, no cyanosis no clubbing SKIN:  No rashes no nodules NEURO:  Cranial nerves II through XII grossly intact, motor grossly intact throughout PSYCH:  Cognitively intact, oriented to person place and time   ASSESSMENT:    1. Chest pain of uncertain etiology  2. Essential hypertension   3. Exertional dyspnea    PLAN:    Essential hypertension Previously gestational hypertension. Now she has essential hypertension.  We discussed lifestyle interventions.  Increase exercise to at least 150 minutes weekly.  She will limit sodium to 2500mg  daily.  Add HCTZ 25mg  daily and amlodipine 5mg .  Stop metoprolol.  Check BMP and TSH in a week.  She will track her BP and bring to follow up.   Exertional dyspnea She has noted exertional dyspnea and some chest tightness when walking up and down steps.  We will get a coronary CTA to make sure there is no obstructive coronary disease causing these problems.   Disposition: FU with PharmD in 1 month. FU with Melanie Iannelli C. Oval Linsey, MD, [facility] in 3 months.  Medication Adjustments/Labs and Tests Ordered: Current medicines are reviewed at length with the patient today.  Concerns regarding medicines are outlined above.   Orders Placed This Encounter  Procedures   CT CORONARY MORPH W/CTA COR W/SCORE W/CA W/CM &/OR WO/CM   TSH   Basic metabolic panel    Meds ordered this encounter  Medications   hydrochlorothiazide (HYDRODIURIL) 25 MG tablet    Sig: Take 1 tablet (25 mg total) by  mouth daily.    Dispense:  90 tablet    Refill:  3   amLODipine (NORVASC) 5 MG tablet    Sig: Take 1 tablet (5 mg total) by mouth daily.    Dispense:  90 tablet    Refill:  3   metoprolol tartrate (LOPRESSOR) 100 MG tablet    Sig: Take 1 tablet 2 hours prior to ct    Dispense:  1 tablet    Refill:  0    D/C metoprolol suc    Patient Instructions  Medication Instructions:  STOP METOPROLOL   START AMLODIPINE 5 MG DAILY   START HYDROCHLOROTHIAZIDE 25 MG DAILY   TAKE METOPROLOL TART 100 MG 2 HOURS PRIOR TO CARDIAC CT   Labwork: BMET/TSH IN 1 WEEK   Testing/Procedures: Your physician has requested that you have cardiac CT. Cardiac computed tomography (CT) is a painless test that uses an x-ray machine to take clear, detailed pictures of your heart. For further information please visit [email or web address]. Please follow instruction sheet as given. THE OFFICE WILL CALL YOU TO SCHEDULE ONCE YOUR INSURANCE HAS BEEN REVIEWED   Follow-Up: 08/10/2021 3:00 PM WITH PHARM D AT [facility] LOCATION   10/06/2021 2:30 PM WITH DR [place] AT [facility] LOCATION   Any Other Special Instructions Will Be Listed Below (If Applicable).  MONITOR YOUR BLOOD PRESSURE TWICE A DAY, LOG IN THE BOOK PROVIDED. BRING THE BOOK AND YOUR BLOOD PRESSURE MACHINE TO YOUR FOLLOW UP IN 1 MONTH     Your cardiac CT will be scheduled at one of the below locations:   [facility] [address] [place], [place] [address] [phone]  [place] [address] [place],  [address] [phone]  If scheduled at [facility], please arrive at the [facility] main entrance (entrance A) of [facility] 30 minutes prior to test start time. You can use the FREE valet parking offered at the main entrance (encouraged to control the heart rate for the test) Proceed to the [facility] Radiology Department (first floor) to check-in and test  prep.  If scheduled at [facility], please arrive 15 mins early for check-in and test prep.  Please follow these  instructions carefully (unless otherwise directed):  Hold all erectile dysfunction medications at least 3 days (72 hrs) prior to test.  On the Night Before the Test: Be sure to Drink plenty of water. Do not consume any caffeinated/decaffeinated beverages or chocolate 12 hours prior to your test. Do not take any antihistamines 12 hours prior to your test. If the patient has contrast allergy: Patient will need a prescription for Prednisone and very clear instructions (as follows): Prednisone 50 mg - take 13 hours prior to test Take another Prednisone 50 mg 7 hours prior to test Take another Prednisone 50 mg 1 hour prior to test Take Benadryl 50 mg 1 hour prior to test Patient must complete all four doses of above prophylactic medications. Patient will need a ride after test due to Benadryl.  On the Day of the Test: Drink plenty of water until 1 hour prior to the test. Do not eat any food 4 hours prior to the test. You may take your regular medications prior to the test.  Take metoprolol (Lopressor) two hours prior to test. HOLD Furosemide/Hydrochlorothiazide morning of the test. FEMALES- please wear underwire-free bra if available, avoid dresses & tight clothing  After the Test: Drink plenty of water. After receiving IV contrast, you may experience a mild flushed feeling. This is normal. On occasion, you may experience a mild rash up to 24 hours after the test. This is not dangerous. If this occurs, you can take Benadryl 25 mg and increase your fluid intake. If you experience trouble breathing, this can be serious. If it is severe call 911 IMMEDIATELY. If it is mild, please call our office. If you take any of these medications: Glipizide/Metformin, Avandament, Glucavance, please do not take 48 hours after completing test unless otherwise  instructed.  Please allow 2-4 weeks for scheduling of routine cardiac CTs. Some insurance companies require a pre-authorization which may delay scheduling of this test.   For non-scheduling related questions, please contact the cardiac imaging nurse navigator should you have any questions/concerns: Marchia Bond, Cardiac Imaging Nurse Navigator Gordy Clement, Cardiac Imaging Nurse Navigator [place] [facility] Direct Office Dial: [phone]   For scheduling needs, including cancellations and rescheduling, please call [place], [phone].  Cardiac CT Angiogram A cardiac CT angiogram is a procedure to look at the heart and the area around the heart. It may be done to help find the cause of chest pains or other symptoms of heart disease. During this procedure, a substance called contrast dye is injected into the blood vessels in the area to be checked. A large X-ray machine, called a CT scanner, then takes detailed pictures of the heart and the surrounding area. The procedure is also sometimes called a coronary CT angiogram, coronary artery scanning, or CTA. A cardiac CT angiogram allows the health care provider to see how well blood is flowing to and from the heart. The health care provider will be able to see if there are any problems, such as: Blockage or narrowing of the coronary arteries in the heart. Fluid around the heart. Signs of weakness or disease in the muscles, valves, and tissues of the heart. Tell a health care provider about: Any allergies you have. This is especially important if you have had a previous allergic reaction to contrast dye. All medicines you are taking, including vitamins, herbs, eye drops, creams, and over-the-counter medicines. Any blood disorders you have. Any surgeries you have had. Any medical conditions you have. Whether you are pregnant or  may be pregnant. Any anxiety disorders, chronic pain, or other conditions you have that may  increase your stress or prevent you from lying still. What are the risks? Generally, this is a safe procedure. However, problems may occur, including: Bleeding. Infection. Allergic reactions to medicines or dyes. Damage to other structures or organs. Kidney damage from the contrast dye that is used. Increased risk of cancer from radiation exposure. This risk is low. Talk with your health care provider about: The risks and benefits of testing. How you can receive the lowest dose of radiation. What happens before the procedure? Wear comfortable clothing and remove any jewelry, glasses, dentures, and hearing aids. Follow instructions from your health care provider about eating and drinking. This may include: For 12 hours before the procedure -- avoid caffeine. This includes tea, coffee, soda, energy drinks, and diet pills. Drink plenty of water or other fluids that do not have caffeine in them. Being well hydrated can prevent complications. For 4-6 hours before the procedure -- stop eating and drinking. The contrast dye can cause nausea, but this is less likely if your stomach is empty. Ask your health care provider about changing or stopping your regular medicines. This is especially important if you are taking diabetes medicines, blood thinners, or medicines to treat problems with erections (erectile dysfunction). What happens during the procedure?  Hair on your chest may need to be removed so that small sticky patches called electrodes can be placed on your chest. These will transmit information that helps to monitor your heart during the procedure. An IV will be inserted into one of your veins. You might be given a medicine to control your heart rate during the procedure. This will help to ensure that good images are obtained. You will be asked to lie on an exam table. This table will slide in and out of the CT machine during the procedure. Contrast dye will be injected into the IV. You might  feel warm, or you may get a metallic taste in your mouth. You will be given a medicine called nitroglycerin. This will relax or dilate the arteries in your heart. The table that you are lying on will move into the CT machine tunnel for the scan. The person running the machine will give you instructions while the scans are being done. You may be asked to: Keep your arms above your head. Hold your breath. Stay very still, even if the table is moving. When the scanning is complete, you will be moved out of the machine. The IV will be removed. The procedure may vary among health care providers and hospitals. What can I expect after the procedure? After your procedure, it is common to have: A metallic taste in your mouth from the contrast dye. A feeling of warmth. A headache from the nitroglycerin. Follow these instructions at home: Take over-the-counter and prescription medicines only as told by your health care provider. If you are told, drink enough fluid to keep your urine pale yellow. This will help to flush the contrast dye out of your body. Most people can return to their normal activities right after the procedure. Ask your health care provider what activities are safe for you. It is up to you to get the results of your procedure. Ask your health care provider, or the department that is doing the procedure, when your results will be ready. Keep all follow-up visits as told by your health care provider. This is important. Contact a health care provider if:  You have any symptoms of allergy to the contrast dye. These include: Shortness of breath. Rash or hives. A racing heartbeat. Summary A cardiac CT angiogram is a procedure to look at the heart and the area around the heart. It may be done to help find the cause of chest pains or other symptoms of heart disease. During this procedure, a large X-ray machine, called a CT scanner, takes detailed pictures of the heart and the surrounding  area after a contrast dye has been injected into blood vessels in the area. Ask your health care provider about changing or stopping your regular medicines before the procedure. This is especially important if you are taking diabetes medicines, blood thinners, or medicines to treat erectile dysfunction. If you are told, drink enough fluid to keep your urine pale yellow. This will help to flush the contrast dye out of your body. This information is not intended to replace advice given to you by your health care provider. Make sure you discuss any questions you have with your health care provider. Document Revised: 04/06/2021 Document Reviewed: 03/19/2019 Elsevier Patient Education  2022 [place].    [employer]ump[employer],acting as a Education administrator for Skeet Latch, MD.,have documented all relevant documentation on the behalf of Skeet Latch, MD,as directed by  Skeet Latch, MD while in the presence of Skeet Latch, MD.  I, [place] Oval Linsey, MD have reviewed all documentation for this visit.  The documentation of the exam, diagnosis, procedures, and orders on 07/07/2021 are all accurate and complete.   Signed, Skeet Latch, MD  07/07/2021 6:09 PM    [place]

## 2021-07-07 NOTE — Assessment & Plan Note (Signed)
She has noted exertional dyspnea and some chest tightness when walking up and down steps.  We will get a coronary CTA to make sure there is no obstructive coronary disease causing these problems.

## 2021-07-08 NOTE — Addendum Note (Signed)
Addended by: Gerald Stabs on: 07/08/2021 04:46 PM   Modules accepted: Orders

## 2021-07-19 ENCOUNTER — Telehealth (HOSPITAL_COMMUNITY): Payer: Self-pay | Admitting: Emergency Medicine

## 2021-07-19 NOTE — Telephone Encounter (Signed)
Attempted to call patient regarding upcoming cardiac CT appointment. Left message on voicemail with name and callback number Makenzee Choudhry RN Navigator Cardiac Imaging [facility] [phone] Office [phone] Cell  Reminded to get labs 

## 2021-07-20 ENCOUNTER — Telehealth (HOSPITAL_COMMUNITY): Payer: Self-pay | Admitting: Emergency Medicine

## 2021-07-20 ENCOUNTER — Other Ambulatory Visit: Payer: Self-pay

## 2021-07-20 ENCOUNTER — Ambulatory Visit (HOSPITAL_COMMUNITY)
Admission: RE | Admit: 2021-07-20 | Discharge: 2021-07-20 | Disposition: A | Discharge status: Home / Self Care | Payer: BC Managed Care – PPO | Source: Ambulatory Visit | Attending: Cardiovascular Disease | Admitting: Cardiovascular Disease

## 2021-07-20 DIAGNOSIS — R0609 Other forms of dyspnea: Secondary | ICD-10-CM | POA: Insufficient documentation

## 2021-07-20 DIAGNOSIS — R079 Chest pain, unspecified: Secondary | ICD-10-CM | POA: Insufficient documentation

## 2021-07-20 DIAGNOSIS — I10 Essential (primary) hypertension: Secondary | ICD-10-CM | POA: Insufficient documentation

## 2021-07-20 LAB — POCT I-STAT CREATININE: Creatinine, Ser: 0.7 mg/dL (ref 0.44–1.00)

## 2021-07-20 MED ORDER — NITROGLYCERIN 0.4 MG SL SUBL
SUBLINGUAL_TABLET | SUBLINGUAL | Status: AC
  Filled 2021-07-20: qty 2

## 2021-07-20 MED ORDER — NITROGLYCERIN 0.4 MG SL SUBL
0.8000 mg | SUBLINGUAL_TABLET | Freq: Once | SUBLINGUAL | Status: AC
  Administered 2021-07-20: 16:00:00 0.8 mg via SUBLINGUAL

## 2021-07-20 MED ORDER — IOHEXOL 350 MG/ML SOLN
100.0000 mL | Freq: Once | INTRAVENOUS | Status: AC | PRN
  Administered 2021-07-20: 16:00:00 100 mL via INTRAVENOUS

## 2021-07-20 NOTE — Telephone Encounter (Signed)
Pt returning phone call regarding upcoming cardiac imaging study; pt verbalizes understanding of appt date/time, parking situation and where to check in, pre-test NPO status and medications ordered, and verified current allergies; name and call back number provided for further questions should they arise Marchia Bond RN Navigator Cardiac Imaging [facility] [phone] office [phone] cell  Pt states she is okay to do istat creat on time of test.  Denies iv issues 100mg  metoprolol 2:30p Arrival time 4:00p

## 2021-08-09 ENCOUNTER — Telehealth: Payer: Self-pay | Admitting: Cardiovascular Disease

## 2021-08-09 NOTE — Telephone Encounter (Signed)
Patient is requesting a call back to review CT results.

## 2021-08-09 NOTE — Telephone Encounter (Signed)
Attempted to call patient, no answer or voicemail option.   Patient calling for CT results, preliminary report in but not resulted yet!

## 2021-08-09 NOTE — Telephone Encounter (Signed)
Call attempt number 2 to patient. No answer, left voicemail with answer to previous CT question.

## 2021-08-10 ENCOUNTER — Ambulatory Visit: Payer: BC Managed Care – PPO

## 2021-08-13 LAB — BASIC METABOLIC PANEL
BUN/Creatinine Ratio: 23 (ref 9–23)
BUN: 17 mg/dL (ref 6–24)
CO2: 23 mmol/L (ref 20–29)
Calcium: 9.7 mg/dL (ref 8.7–10.2)
Chloride: 97 mmol/L (ref 96–106)
Creatinine, Ser: 0.75 mg/dL (ref 0.57–1.00)
Glucose: 136 mg/dL — ABNORMAL HIGH (ref 70–99)
Potassium: 4 mmol/L (ref 3.5–5.2)
Sodium: 136 mmol/L (ref 134–144)
eGFR: 101 mL/min/{1.73_m2} (ref >59)

## 2021-08-13 LAB — TSH: TSH: 0.82 u[IU]/mL (ref 0.450–4.500)

## 2021-08-26 NOTE — Telephone Encounter (Signed)
Patient is following up, again requesting to review her CT results.

## 2021-08-26 NOTE — Telephone Encounter (Signed)
Only preliminary results available. Patient to be called when results are available!

## 2021-09-02 ENCOUNTER — Other Ambulatory Visit: Payer: Self-pay

## 2021-09-02 ENCOUNTER — Ambulatory Visit (INDEPENDENT_AMBULATORY_CARE_PROVIDER_SITE_OTHER): Payer: BC Managed Care – PPO | Admitting: Pharmacist Clinician (PhC)/ Clinical Pharmacy Specialist

## 2021-09-02 DIAGNOSIS — I10 Essential (primary) hypertension: Secondary | ICD-10-CM | POA: Diagnosis not present

## 2021-09-02 MED ORDER — CHLORTHALIDONE 25 MG PO TABS: 25.0000 mg | ORAL_TABLET | Freq: Every day | ORAL | 3 refills | Status: DC

## 2021-09-02 NOTE — Patient Instructions (Signed)
Return for a a follow up appointment March 2 with Dr. Oval Linsey at the [facility] office  Check your blood pressure at home daily (if able) and keep record of the readings.  Take your BP meds as follows:  When you run out of hydrochlorothiazide, switch to chlorthalidone 25 mg once daily.  Continue with amlodipine.    Bring all of your meds, your BP cuff and your record of home blood pressures to your next appointment.  Exercise as youre able, try to walk approximately 30 minutes per day.  Keep salt intake to a minimum, especially watch canned and prepared boxed foods.  Eat more fresh fruits and vegetables and fewer canned items.  Avoid eating in fast food restaurants.    HOW TO TAKE YOUR BLOOD PRESSURE: Rest 5 minutes before taking your blood pressure.  Dont smoke or drink caffeinated beverages for at least 30 minutes before. Take your blood pressure before (not after) you eat. Sit comfortably with your back supported and both feet on the floor (dont cross your legs). Elevate your arm to heart level on a table or a desk. Use the proper sized cuff. It should fit smoothly and snugly around your bare upper arm. There should be enough room to slip a fingertip under the cuff. The bottom edge of the cuff should be 1 inch above the crease of the elbow. Ideally, take 3 measurements at one sitting and record the average.

## 2021-09-02 NOTE — Progress Notes (Signed)
° ° ° °  09/05/2021 [name] 1975-08-31 [id number]   HPI:  [name] is a 46 y.o. female patient of Dr Oval Linsey, with a [place] below who presents today for hypertension clinic evaluation.  She was seen by Dr. Oval Linsey last month, at which time her blood pressure was 178/104.  She had previously been diagnosed with gestational hypertension in all 3 of her pregnancies including preeclampsia with her first and delivering second and third 3-4 weeks early.  She notes that her pressure had corrected itself after each pregnancy, however in the past year it has increased again.  She notes that her job is stressful and believes this has some part in her hypertension.    Today she reports feeling well overall.  She was at another MD visit on Monday at which time her diastolic pressure was recorded at 97.  No problems with compliance or side effects.     Blood Pressure Goal:  130/80  Current Medications: hctz 25 mg qd, amlodipine 5 mg qd  Family Hx: parents both with hypertension; 1 brother with hypertension;  Social Hx: no tobacco, occasional alcohol; green tea; no coffee or soda  Diet: more home cooked meals, not adding salt, good mix of proteins, has been increasing f/v over past months;   Exercise: occasional walks  Home BP readings:  mornings usually better 120/80's, higher in afternoons, today 474/25; diastolic staying 95-63'O  Intolerances: codeiine  Labs: 08/12/21:  TSH 0.820; Na 136, 4.0, Glu 136, BN 17, SCr 0.75, GFR 101   Wt Readings from Last 3 Encounters:  07/07/21 225 lb 8 oz (102.3 kg)  02/22/21 211 lb (95.7 kg)  08/03/18 221 lb 14.4 oz (100.7 kg)   BP Readings from Last 3 Encounters:  09/02/21 122/76  07/20/21 109/68  07/07/21 (!) 178/104   Pulse Readings from Last 3 Encounters:  07/07/21 66  02/22/21 72  10/14/18 69    Current Outpatient Medications  Medication Sig Dispense Refill   chlorthalidone (HYGROTON) 25 MG tablet Take 1 tablet (25 mg total) by mouth  daily. 90 tablet 3   amLODipine (NORVASC) 5 MG tablet Take 1 tablet (5 mg total) by mouth daily. 90 tablet 3   No current facility-administered medications for this visit.    Allergies  Allergen Reactions   Codeine Anaphylaxis    Tolerates oxycodone    Past Medical History:  Diagnosis Date   Diabetes mellitus without complication ([place])    gestational   Essential hypertension 07/07/2021   Exertional dyspnea 07/07/2021   Gestational diabetes    Postpartum care following cesarean delivery Indication: Repeat, SROM (12/11) 07/17/2016   Pregnancy induced hypertension     Blood pressure 122/76, unknown if currently breastfeeding.  Essential hypertension Patient with essential hypertension, with readings elevating throughout the day.  Suspect that HCTZ is not giving her full benefit, so will switch to the longer acting chlorthalidone 25 mg.  She should continue with other medications and home monitoring.  Will need to take home reading log as well as BP device to next appointment.  She is scheduled to follow up with Dr. Oval Linsey in 6 weeks.     Tommy Medal PharmD CPP [facility] [address] [place], [place] [address] [phone]

## 2021-09-05 ENCOUNTER — Other Ambulatory Visit: Payer: Self-pay | Admitting: Interventional Radiology

## 2021-09-05 ENCOUNTER — Telehealth (HOSPITAL_BASED_OUTPATIENT_CLINIC_OR_DEPARTMENT_OTHER): Payer: Self-pay | Admitting: *Deleted

## 2021-09-05 DIAGNOSIS — D250 Submucous leiomyoma of uterus: Secondary | ICD-10-CM

## 2021-09-05 NOTE — Telephone Encounter (Signed)
Left message to call back  

## 2021-09-05 NOTE — Assessment & Plan Note (Signed)
Patient with essential hypertension, with readings elevating throughout the day.  Suspect that HCTZ is not giving her full benefit, so will switch to the longer acting chlorthalidone 25 mg.  She should continue with other medications and home monitoring.  Will need to take home reading log as well as BP device to next appointment.  She is scheduled to follow up with Dr. Oval Linsey in 6 weeks.

## 2021-09-05 NOTE — Telephone Encounter (Signed)
-----   Message from Skeet Latch, MD sent at 09/05/2021 10:41 AM EST ----- Normal kidney function and thyroid function.

## 2021-09-05 NOTE — Telephone Encounter (Signed)
See phone note 1/30

## 2021-09-05 NOTE — Telephone Encounter (Signed)
-----   Message from Skeet Latch, MD sent at 09/05/2021 10:40 AM EST ----- Coronary arteries are normal.  This is great news.

## 2021-09-06 NOTE — Telephone Encounter (Signed)
Advised patient of CT and labs

## 2021-09-06 NOTE — Telephone Encounter (Signed)
Patient was returning Melinda's call

## 2021-10-06 ENCOUNTER — Other Ambulatory Visit: Payer: Self-pay

## 2021-10-06 ENCOUNTER — Ambulatory Visit (HOSPITAL_BASED_OUTPATIENT_CLINIC_OR_DEPARTMENT_OTHER): Payer: BC Managed Care – PPO | Admitting: Cardiovascular Disease

## 2021-10-06 ENCOUNTER — Encounter (HOSPITAL_BASED_OUTPATIENT_CLINIC_OR_DEPARTMENT_OTHER): Payer: Self-pay | Admitting: Cardiovascular Disease

## 2021-10-06 DIAGNOSIS — E1169 Type 2 diabetes mellitus with other specified complication: Secondary | ICD-10-CM | POA: Diagnosis not present

## 2021-10-06 DIAGNOSIS — I10 Essential (primary) hypertension: Secondary | ICD-10-CM

## 2021-10-06 DIAGNOSIS — E669 Obesity, unspecified: Secondary | ICD-10-CM

## 2021-10-06 HISTORY — DX: Type 2 diabetes mellitus with other specified complication: E11.69

## 2021-10-06 NOTE — Assessment & Plan Note (Signed)
Recent hemoglobin A1c was 7.7%.  She was appropriately started on metformin.  We did discuss Ozempic.  Given her obesity and newly diagnosed diabetes, I think that this would be a good idea.  She is going to follow-up with our pharmacist to get started.  We did discuss ? ?You have been prescribed a GLP-1 today called Ozempic.  ?This is a once weekly injection that helps you lose weight and maintain weight loss.  This medication works best when combined with healthy diet and regular exercise.  GLP1 medications help you lose weight by reducing appetite and helping you feel fuller longer. Most common side effects include nausea, vomiting, diarrhea. These side effects can be limited by eating slowly and eating small meals and often improve after a few days. We have Referred you to our pharmacy team.  ? ? ?

## 2021-10-06 NOTE — Assessment & Plan Note (Signed)
Blood pressure is much better controlled.  She is encouraged to increase her exercise back to 150 minutes weekly and to start back with the Noom given that she was doing well with this.  Continue amlodipine and chlorthalidone. ?

## 2021-10-06 NOTE — Patient Instructions (Signed)
Medication Instructions:  ?Your physician recommends that you continue on your current medications as directed. Please refer to the Current Medication list given to you today. ? ?Labwork: ?NONE ? ?Testing/Procedures: ?NONE ? ?Follow-Up: ?3 MONTHS WITH DR [place]  ? ?Your physician recommends that you schedule a follow-up appointment AS:TMHDQ D TO DISCUSS OZEMPIC  ? ?If you need a refill on your cardiac medications before your next appointment, please call your pharmacy. ?

## 2021-10-06 NOTE — Progress Notes (Unsigned)
Cardiology Office Note:    Date:  10/06/2021   ID:  [name], DOB 04-08-1976, MRN [id number]  PCP:  Johna Roles, PA   [facility] Providers Cardiologist:  None     Referring MD: Johna Roles, PA   No chief complaint on file.  History of Present Illness:    [name] is a 46 y.o. female with a hx of pregnancy-induced hypertension, gestational diabetes,  here for follow-up. She was initially seen 07/07/2021 for the evaluation of chronic hypertension. She saw Dr. Garwin Brothers on 04/2021 and her BP was 163/108. She was started on metoprolol at the time.    At her last appointment she confirmed issues with high blood pressure since her pregnancy in 2019. She also reported more fatigue and SOB with minimal exertion. She followed up with our pharmacist 09/02/2021 and her blood pressure was 120/84. Her at home readings elevated throughout the day. It was suspected that HCTZ was not providing the full effect, so she was switched to longer acting chlorthalidone 25 mg. She also reported exertional dyspnea. She had a coronary CTA 07/2021 which revealed no CAD. Today, she is feeling good overall. She has been exercising about 2 days a week walking around her neighborhood. Generally she feels fine while walking, and will become short of breath after reaching the top of a hill. She will keep working on increasing her exercise. Lately she had joined a diet program and has done well with preparing meals at home to take to work. She denies any palpitations, chest pain, or peripheral edema. No lightheadedness, headaches, syncope, orthopnea, or PND.  Previous antihypertensives: Nifedipine Amlodipine  Past Medical History:  Diagnosis Date   Diabetes mellitus type 2 in obese ([place]) 10/06/2021   Diabetes mellitus without complication ([place])    gestational   Essential hypertension 07/07/2021   Exertional dyspnea 07/07/2021   Gestational diabetes    Postpartum care following cesarean delivery  Indication: Repeat, SROM (12/11) 07/17/2016   Pregnancy induced hypertension     Past Surgical History:  Procedure Laterality Date   BREAST LUMPECTOMY     BREAST SURGERY     CESAREAN SECTION     CESAREAN SECTION N/A 07/17/2016   Procedure: CESAREAN SECTION;  Surgeon: Servando Salina, MD;  Location: [place];  Service: Obstetrics;  Laterality: N/A;   CESAREAN SECTION WITH BILATERAL TUBAL LIGATION N/A 08/03/2018   Procedure: Repeat CESAREAN SECTION;  Surgeon: Servando Salina, MD;  Location: [place];  Service: Obstetrics;  Laterality: N/A;  EDD: 08/23/18 Allergy: Codeine   IR ANGIOGRAM PELVIS SELECTIVE OR SUPRASELECTIVE  02/22/2021   IR ANGIOGRAM SELECTIVE EACH ADDITIONAL VESSEL  02/22/2021   IR ANGIOGRAM SELECTIVE EACH ADDITIONAL VESSEL  02/22/2021   IR EMBO TUMOR ORGAN ISCHEMIA INFARCT INC GUIDE ROADMAPPING  02/22/2021   IR RADIOLOGIST EVAL & MGMT  12/29/2020   IR RADIOLOGIST EVAL & MGMT  01/11/2021   IR RADIOLOGIST EVAL & MGMT  04/05/2021   IR US GUIDE VASC ACCESS LEFT  02/22/2021    Current Medications: Current Meds  Medication Sig   amLODipine (NORVASC) 5 MG tablet Take 1 tablet (5 mg total) by mouth daily.   chlorthalidone (HYGROTON) 25 MG tablet Take 1 tablet (25 mg total) by mouth daily.   metFORMIN (GLUCOPHAGE-XR) 500 MG 24 hr tablet Take 1 tablet by mouth daily.     Allergies:   Codeine   Social History   Socioeconomic History   Marital status: Married    Spouse name:  Not on file   Number of children: Not on file   Years of education: Not on file   Highest education level: Not on file  Occupational History   Occupation: preschool teacher  Tobacco Use   Smoking status: Never   Smokeless tobacco: Never  Vaping Use   Vaping Use: Never used  Substance and Sexual Activity   Alcohol use: Not Currently    Comment: ocassional    Drug use: No   Sexual activity: Yes    Birth control/protection: None  Other Topics Concern   Not on file  Social  History Narrative   Not on file   Social Determinants of Health   Financial Resource Strain: Low Risk    Difficulty of Paying Living Expenses: Not hard at all  Food Insecurity: No Food Insecurity   Worried About Charity fundraiser in the Last Year: Never true   Gore in the Last Year: Never true  Transportation Needs: No Transportation Needs   Lack of Transportation (Medical): No   Lack of Transportation (Non-Medical): No  Physical Activity: Inactive   Days of Exercise per Week: 0 days   Minutes of Exercise per Session: 0 min  Stress: Not on file  Social Connections: Not on file     Family History: The patient's family history includes Diabetes in her brother, mother, and sister; Heart disease in her brother; Hypertension in her mother.  ROS:   Please see the history of present illness.    (+) Exertional shortness of breath All other systems reviewed and are negative.  EKGs/Labs/Other Studies Reviewed:    The following studies were reviewed today:  Coronary CTA 07/20/2021: FINDINGS: Quality: Fair, attenuation artifact, HR 65   Coronary calcium score: The patient's coronary artery calcium score is 0, which places the patient in the 0 percentile.   Coronary arteries: Normal coronary origins.  Right dominance.   Right Coronary Artery: Dominant.  Normal.   Left Main Coronary Artery: Normal. Bifurcates into the LAD and LCx arteries.   Left Anterior Descending Coronary Artery: Large anterior vessel that wraps around the apex. No disease. Several small diagonal branches without disease.   Left Circumflex Artery: AV groove LCx vessel without disease.   Aorta: Normal size, 26 mm at the mid ascending aorta (level of the PA bifurcation) measured double oblique. No calcifications. No dissection.   Aortic Valve: Trileaflet. No calcifications.   Other findings:   Normal pulmonary vein drainage into the left atrium.   Normal left atrial appendage without a  thrombus.   Normal size of the pulmonary artery.   IMPRESSION: 1. No evidence of CAD, CADRADS = 0.   2. Coronary calcium score of 0. This was 0 percentile for age and sex matched control.   3. Normal coronary origin with right dominance.   4. Consider non-coronary causes of chest pain.  EKG:   EKG is personally reviewed. 10/06/2021: EKG was not ordered. 07/07/2021: Sinus rhythm. Rate 66 bpm.  Recent Labs: 02/22/2021: Hemoglobin 11.6; Platelets 288 08/12/2021: BUN 17; Creatinine, Ser 0.75; Potassium 4.0; Sodium 136; TSH 0.820   Recent Lipid Panel    Component Value Date/Time   CHOL 186 09/27/2017 1554   TRIG 52 09/27/2017 1554   HDL 51 09/27/2017 1554   CHOLHDL 3.6 09/27/2017 1554   LDLCALC 125 (H) 09/27/2017 1554        Physical Exam:    Wt Readings from Last 3 Encounters:  10/06/21 224 lb 9.6 oz (101.9 kg)  07/07/21 225 lb 8 oz (102.3 kg)  02/22/21 211 lb (95.7 kg)    VS:  BP 125/84 (BP Location: Right Arm, Patient Position: Sitting, Cuff Size: Large)    Pulse 82    Ht 5\' 3"  (1.6 m)    Wt 224 lb 9.6 oz (101.9 kg)    SpO2 100%    BMI 39.79 kg/m  , BMI Body mass index is 39.79 kg/m. GENERAL:  Well appearing HEENT: Pupils equal round and reactive, fundi not visualized, oral mucosa unremarkable NECK:  No jugular venous distention, waveform within normal limits, carotid upstroke brisk and symmetric, no bruits, no thyromegaly LUNGS:  Clear to auscultation bilaterally HEART:  RRR.  PMI not displaced or sustained,S1 and S2 within normal limits, no S3, no S4, no clicks, no rubs, no murmurs ABD:  Flat, positive bowel sounds normal in frequency in pitch, no bruits, no rebound, no guarding, no midline pulsatile mass, no hepatomegaly, no splenomegaly EXT:  2 plus pulses throughout, no edema, no cyanosis no clubbing SKIN:  No rashes no nodules NEURO:  Cranial nerves II through XII grossly intact, motor grossly intact throughout PSYCH:  Cognitively intact, oriented to person place and  time   ASSESSMENT:    1. Essential hypertension   2. Diabetes mellitus type 2 in obese [facility])     PLAN:    Essential hypertension Blood pressure is much better controlled.  She is encouraged to increase her exercise back to 150 minutes weekly and to start back with the Noom given that she was doing well with this.  Continue amlodipine and chlorthalidone.  Diabetes mellitus type 2 in obese (H[facility]) Recent hemoglobin A1c was 7.7%.  She was appropriately started on metformin.  We did discuss Ozempic.  Given her obesity and newly diagnosed diabetes, I think that this would be a good idea.  She is going to follow-up with our pharmacist to get started.  We did discuss  You have been prescribed a GLP-1 today called Ozempic.  This is a once weekly injection that helps you lose weight and maintain weight loss.  This medication works best when combined with healthy diet and regular exercise.  GLP1 medications help you lose weight by reducing appetite and helping you feel fuller longer. Most common side effects include nausea, vomiting, diarrhea. These side effects can be limited by eating slowly and eating small meals and often improve after a few days. We have Referred you to our pharmacy team.      Disposition: FU with Tiffany C. Oval Linsey, MD, [facility] in 3 months.  Medication Adjustments/Labs and Tests Ordered: Current medicines are reviewed at length with the patient today.  Concerns regarding medicines are outlined above.   No orders of the defined types were placed in this encounter.  No orders of the defined types were placed in this encounter.  Patient Instructions  Medication Instructions:  Your physician recommends that you continue on your current medications as directed. Please refer to the Current Medication list given to you today.  Labwork: NONE  Testing/Procedures: NONE  Follow-Up: 3 MONTHS WITH DR [facility]   Your physician recommends that you schedule a follow-up  appointment ZO:XWRUE D TO DISCUSS OZEMPIC   If you need a refill on your cardiac medications before your next appointment, please call your pharmacy.   I,Mathew Stumpf,acting as a Education administrator for Skeet Latch, MD.,have documented all relevant documentation on the behalf of Skeet Latch, MD,as directed by  Skeet Latch, MD while in the presence of [place],  MD.  I, Tiffany C. Oval Linsey, MD have reviewed all documentation for this visit.  The documentation of the exam, diagnosis, procedures, and orders on 10/06/2021 are all accurate and complete.  Signed, Skeet Latch, MD  10/06/2021 2:56 PM    [place]

## 2021-10-10 ENCOUNTER — Other Ambulatory Visit: Payer: Self-pay | Admitting: Physician Assistant

## 2021-10-10 ENCOUNTER — Ambulatory Visit
Admission: RE | Admit: 2021-10-10 | Discharge: 2021-10-10 | Disposition: A | Discharge status: Home / Self Care | Payer: BC Managed Care – PPO | Source: Ambulatory Visit | Attending: Physician Assistant | Admitting: Physician Assistant

## 2021-10-10 DIAGNOSIS — R062 Wheezing: Secondary | ICD-10-CM

## 2021-10-10 DIAGNOSIS — R053 Chronic cough: Secondary | ICD-10-CM

## 2021-11-01 ENCOUNTER — Ambulatory Visit (INDEPENDENT_AMBULATORY_CARE_PROVIDER_SITE_OTHER): Payer: BC Managed Care – PPO | Admitting: Pharmacist

## 2021-11-01 ENCOUNTER — Encounter: Payer: Self-pay | Admitting: Pharmacist

## 2021-11-01 ENCOUNTER — Telehealth: Payer: Self-pay | Admitting: Pharmacist

## 2021-11-01 ENCOUNTER — Other Ambulatory Visit: Payer: Self-pay

## 2021-11-01 VITALS — BP 108/76 | HR 78 | Resp 17 | Ht 63.0 in | Wt 223.0 lb

## 2021-11-01 DIAGNOSIS — E1169 Type 2 diabetes mellitus with other specified complication: Secondary | ICD-10-CM

## 2021-11-01 DIAGNOSIS — E669 Obesity, unspecified: Secondary | ICD-10-CM | POA: Diagnosis not present

## 2021-11-01 DIAGNOSIS — E7800 Pure hypercholesterolemia, unspecified: Secondary | ICD-10-CM | POA: Insufficient documentation

## 2021-11-01 DIAGNOSIS — O24912 Unspecified diabetes mellitus in pregnancy, second trimester: Secondary | ICD-10-CM | POA: Insufficient documentation

## 2021-11-01 DIAGNOSIS — F4320 Adjustment disorder, unspecified: Secondary | ICD-10-CM | POA: Insufficient documentation

## 2021-11-01 MED ORDER — OZEMPIC (0.25 OR 0.5 MG/DOSE) 2 MG/3ML ~~LOC~~ SOPN: PEN_INJECTOR | SUBCUTANEOUS | 1 refills | Status: DC

## 2021-11-01 NOTE — Patient Instructions (Addendum)
It was nice meeting you today ? ?We would like your A1c to be less than 7.0% ? ?Please work on reducing sugars in your diet.  Concentrate on vegetables, lean proteins, healthy fats, and whole grains ? ?We will start a new medication called Ozempic.  You will inject 0.'25mg'$  once weekly for 4 weeks and then increase to 0.'5mg'$  once weekly.   ? ?I will complete the prior authorization for you and activate a copay card for you ? ?Please call with any questions! ? ?Karren Cobble, PharmD, BCACP, [place], CPP ?[address], [address] ?[place], [place], [address] ?Phone: [phone], Fax: [phone]  ? ? ? ? ?

## 2021-11-01 NOTE — Telephone Encounter (Signed)
Called the pharmacy and provided copay card information and they stated that the cost would be $24 and some change.  ?

## 2021-11-01 NOTE — Progress Notes (Signed)
Patient ID: [name]                 DOB: 07/05/1976                    MRN: [id number] ? ? ? ? ?HPI: ?[name] is a 46 y.o. female patient referred to pharmacy clinic by Dr Oval Linsey to initiate weight loss therapy and DM management with GLP1-RA. PMH is significant for obesity complicated by chronic medical conditions including T2DM, history of GDM, HTN, and obesity.  Most recent A1c 7.7 (09/05/21) . Most recent BMI 39.5kg/m2. ? ?Patient presents today in good spirits. BP controlled today. However patient did not realize HCTZ was changed to chlorthalidone and she has remained on HCTZ. ? ?PCP recently started her on metformin XR '500mg'$  once daily and advised her to start checking her blood sugar once weekly. ? ?Patient does not typically eat three meals a day. Breakfast this morning was a cereal bar, yogurt, and grapes. Lunch was can of beef ravioli.  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 ? ?Current weight management medications: N/A ? ?Previously tried meds: N/A ? ?Current meds that may affect weight: N/A ? ?Baseline weight/BMI: 39.5 ? ?Insurance payor:  [place] ? ?Labs: ?Lab Results  ?Component Value Date  ? HGBA1C 6.2 09/27/2017  ? ? ?Wt Readings from Last 1 Encounters:  ?10/06/21 224 lb 9.6 oz (101.9 kg)  ? ? ?BP Readings from Last 1 Encounters:  ?10/06/21 125/84  ? ?Pulse Readings from Last 1 Encounters:  ?10/06/21 82  ? ? ?   ?Component Value Date/Time  ? CHOL 186 09/27/2017 1554  ? TRIG 52 09/27/2017 1554  ? HDL 51 09/27/2017 1554  ? CHOLHDL 3.6 09/27/2017 1554  ? LDLCALC 125 (H) 09/27/2017 1554  ? ? ?Past Medical History:  ?Diagnosis Date  ? Diabetes mellitus type 2 in obese ([place]) 10/06/2021  ? Diabetes mellitus without complication ([place])   ? gestational  ? Essential hypertension 07/07/2021  ? Exertional dyspnea 07/07/2021  ? Gestational diabetes   ? Postpartum care following cesarean delivery Indication: Repeat, SROM (12/11) 07/17/2016  ? Pregnancy  induced hypertension   ? ? ?Current Outpatient Medications on File Prior to Visit  ?Medication Sig Dispense Refill  ? albuterol (VENTOLIN HFA) 108 (90 Base) MCG/ACT inhaler Inhale 2 puffs into the lungs every 4 (four) hours as needed.    ? amLODipine (NORVASC) 5 MG tablet Take 1 tablet (5 mg total) by mouth daily. 90 tablet 3  ? benzonatate (TESSALON) 200 MG capsule Take 200 mg by mouth 3 (three) times daily as needed.    ? chlorthalidone (HYGROTON) 25 MG tablet Take 1 tablet (25 mg total) by mouth daily. 90 tablet 3  ? hydrochlorothiazide (HYDRODIURIL) 25 MG tablet Take 25 mg by mouth daily.    ? metFORMIN (GLUCOPHAGE-XR) 500 MG 24 hr tablet Take 1 tablet by mouth daily.    ? ?No current facility-administered medications on file prior to visit.  ? ? ?Allergies  ?Allergen Reactions  ? Codeine Anaphylaxis  ?  Tolerates oxycodone  ? ? ? ?Assessment/Plan: ? ?1. T2DM -  Patient A1c 7.7 which is above goal of <7.0%. Recommended patient continue metformin and have 3 month A1c follow up with PCP as recommended.  For extra cardio protection, recommended starting Ozempic.  Confirmed patient not pregnant and no personal or family history of medullary thyroid carcinoma (MTC) or Multiple  Endocrine Neoplasia syndrome type 2 (MEN 2).  ? ?Advised patient on common side effects including nausea, diarrhea, dyspepsia, decreased appetite, and fatigue. Counseled patient on reducing meal size and how to titrate medication to minimize side effects. Counseled patient to call if intolerable side effects or if experiencing dehydration, abdominal pain, or dizziness. Patient will adhere to dietary modifications and will target at least 150 minutes of moderate intensity exercise weekly.  ? ?Will start Ozempic 0.'25mg'$  weekly x 4 weeks then increase to 0.'5mg'$  weekly. Patient will discuss if she would like to continue at 0.5 after 2-4 weeks before deciding if she would like to increase to 1.'0mg'$ . ? ?[name], [name], [name], [name], CPP ?[name], [address] ?[place], [place], [address] ?Phone: [phone], Fax: [phone]  ? ? ?

## 2021-11-01 NOTE — Telephone Encounter (Signed)
Copay card for Ozempic: ? ?BIN: [id number] ?PCN: [id number] ?Grp: [id number]  ?ID: [id number] ?

## 2021-11-01 NOTE — Telephone Encounter (Signed)
Pt called in and stated that the medication was on back order at the 1st pharmacy  where it was sent so she asked to send it to another in which I did.  ?

## 2021-11-01 NOTE — Addendum Note (Signed)
Addended by: Allean Found on: 11/01/2021 03:55 PM ? ? Modules accepted: Orders ? ?

## 2021-11-10 ENCOUNTER — Encounter (HOSPITAL_BASED_OUTPATIENT_CLINIC_OR_DEPARTMENT_OTHER): Payer: Self-pay | Admitting: Cardiovascular Disease

## 2021-12-12 ENCOUNTER — Encounter: Payer: Self-pay | Admitting: Pharmacist

## 2022-01-05 ENCOUNTER — Encounter: Payer: Self-pay | Admitting: Pharmacist

## 2022-01-05 DIAGNOSIS — E1169 Type 2 diabetes mellitus with other specified complication: Secondary | ICD-10-CM

## 2022-01-05 MED ORDER — OZEMPIC (1 MG/DOSE) 4 MG/3ML ~~LOC~~ SOPN: 1.0000 mg | PEN_INJECTOR | SUBCUTANEOUS | 2 refills | Status: DC

## 2022-01-05 NOTE — Addendum Note (Signed)
Addended by: Rollen Sox on: 01/05/2022 07:22 PM   Modules accepted: Orders

## 2022-01-11 ENCOUNTER — Ambulatory Visit (HOSPITAL_BASED_OUTPATIENT_CLINIC_OR_DEPARTMENT_OTHER): Payer: BC Managed Care – PPO | Admitting: Cardiovascular Disease

## 2022-01-13 ENCOUNTER — Encounter (HOSPITAL_BASED_OUTPATIENT_CLINIC_OR_DEPARTMENT_OTHER): Payer: Self-pay | Admitting: Cardiovascular Disease

## 2022-01-13 ENCOUNTER — Ambulatory Visit (HOSPITAL_BASED_OUTPATIENT_CLINIC_OR_DEPARTMENT_OTHER): Payer: BC Managed Care – PPO | Admitting: Cardiovascular Disease

## 2022-01-13 DIAGNOSIS — I10 Essential (primary) hypertension: Secondary | ICD-10-CM

## 2022-01-13 DIAGNOSIS — E7800 Pure hypercholesterolemia, unspecified: Secondary | ICD-10-CM | POA: Diagnosis not present

## 2022-01-13 DIAGNOSIS — R0609 Other forms of dyspnea: Secondary | ICD-10-CM | POA: Diagnosis not present

## 2022-01-13 NOTE — Assessment & Plan Note (Signed)
Lipids are elevated but her coronary calcium score is 0.  Therefore we will not start her on a statin at this time.  Consider getting a repeat calcium score in 5 to 10 years.

## 2022-01-13 NOTE — Assessment & Plan Note (Signed)
Blood pressures have been better controlled.  She has been good job with losing weight by increasing her exercise and making better meal choices.  She is also tolerating Ozempic well.  Continue amlodipine and hydrochlorothiazide for now.  Keep checking blood pressures periodically.

## 2022-01-13 NOTE — Progress Notes (Signed)
Cardiology Office Note:    Date:  01/13/2022   ID:  [name], DOB [birth year], MRN [id number]  PCP:  Melanie Roles, PA   [facility] Providers Cardiologist:  None     Referring MD: Melanie Roles, PA   No chief complaint on file.  History of Present Illness:    [name] is a 46 y.o. female with a hx of pregnancy-induced hypertension, gestational diabetes,  here for follow-up. She was initially seen 07/07/2021 for the evaluation of chronic hypertension. She saw Dr. Garwin Brothers on 04/2021 and her BP was 163/108. She was started on metoprolol at the time.  She first developed hypertension when she was pregnant in 2019.  She also reported more fatigue and SOB with minimal exertion. She followed up with our pharmacist 09/02/2021 and her blood pressure was 120/84. Her at home readings elevated throughout the day.  HCTZ was switched to chlorthalidone but she never picked this up from the pharmacy and has continued on HCTZ.[name]   She had a coronary CTA 07/2021 which revealed no CAD.  At her last appointment she was exercising and feeling well.  She was started on Ozempic for diabetes and weight loss.  She has been walking in the after work.  Her goal is to walk 5 days per week.  She gets tired but no significant dyspnea and no chest pain.  She denies lower extremity edema, orthopnea, or PND.  At home her blood pressures been about the same as it was here today in the 120s over 80s.  Thank you  Previous antihypertensives: Nifedipine Amlodipine  Past Medical History:  Diagnosis Date   Diabetes mellitus type 2 in obese ([place]) 10/06/2021   Diabetes mellitus without complication ([place])    gestational   Essential hypertension 07/07/2021   Exertional dyspnea 07/07/2021   Gestational diabetes    Postpartum care following cesarean delivery Indication: Repeat, SROM (12/11) 07/17/2016   Pregnancy induced hypertension     Past Surgical History:  Procedure Laterality Date   BREAST LUMPECTOMY      BREAST SURGERY     CESAREAN SECTION     CESAREAN SECTION N/A 07/17/2016   Procedure: CESAREAN SECTION;  Surgeon: Servando Salina, MD;  Location: [place];  Service: Obstetrics;  Laterality: N/A;   CESAREAN SECTION WITH BILATERAL TUBAL LIGATION N/A 08/03/2018   Procedure: Repeat CESAREAN SECTION;  Surgeon: Servando Salina, MD;  Location: [facility];  Service: Obstetrics;  Laterality: N/A;  EDD: 08/23/18 Allergy: Codeine   IR ANGIOGRAM PELVIS SELECTIVE OR SUPRASELECTIVE  02/22/2021   IR ANGIOGRAM SELECTIVE EACH ADDITIONAL VESSEL  02/22/2021   IR ANGIOGRAM SELECTIVE EACH ADDITIONAL VESSEL  02/22/2021   IR EMBO TUMOR ORGAN ISCHEMIA INFARCT INC GUIDE ROADMAPPING  02/22/2021   IR RADIOLOGIST EVAL & MGMT  12/29/2020   IR RADIOLOGIST EVAL & MGMT  01/11/2021   IR RADIOLOGIST EVAL & MGMT  04/05/2021   IR US GUIDE VASC ACCESS LEFT  02/22/2021    Current Medications: Current Meds  Medication Sig   amLODipine (NORVASC) 5 MG tablet Take 1 tablet (5 mg total) by mouth daily.   hydrochlorothiazide (HYDRODIURIL) 25 MG tablet Take 25 mg by mouth daily.   metFORMIN (GLUCOPHAGE-XR) 500 MG 24 hr tablet Take 1 tablet by mouth daily.   Semaglutide, 1 MG/DOSE, (OZEMPIC, 1 MG/DOSE,) 4 MG/3ML SOPN Inject 1 mg into the skin once a week.     Allergies:   Codeine   Social History   Socioeconomic History  Marital status: Married    Spouse name: Not on file   Number of children: Not on file   Years of education: Not on file   Highest education level: Not on file  Occupational History   Occupation: preschool teacher  Tobacco Use   Smoking status: Never   Smokeless tobacco: Never  Vaping Use   Vaping Use: Never used  Substance and Sexual Activity   Alcohol use: Not Currently    Comment: ocassional    Drug use: No   Sexual activity: Yes    Birth control/protection: None  Other Topics Concern   Not on file  Social History Narrative   Not on file   Social Determinants of Health    Financial Resource Strain: Low Risk  (07/07/2021)   Overall Financial Resource Strain (CARDIA)    Difficulty of Paying Living Expenses: Not hard at all  Food Insecurity: No Food Insecurity (07/07/2021)   Hunger Vital Sign    Worried About Running Out of Food in the Last Year: Never true    [place] in the Last Year: Never true  Transportation Needs: No Transportation Needs (07/07/2021)   PRAPARE - Hydrologist (Medical): No    Lack of Transportation (Non-Medical): No  Physical Activity: Inactive (07/07/2021)   Exercise Vital Sign    Days of Exercise per Week: 0 days    Minutes of Exercise per Session: 0 min  Stress: No Stress Concern Present (07/23/2018)   [place]    Feeling of Stress : Not at all  Social Connections: Not on file     Family History: The patient's family history includes Diabetes in her brother, mother, and sister; Heart disease in her brother; Hypertension in her mother.  ROS:   Please see the history of present illness.    (+) Exertional shortness of breath All other systems reviewed and are negative.  EKGs/Labs/Other Studies Reviewed:    The following studies were reviewed today:  Coronary CTA 07/20/2021: FINDINGS: Quality: Fair, attenuation artifact, HR 65   Coronary calcium score: The patient's coronary artery calcium score is 0, which places the patient in the 0 percentile.   Coronary arteries: Normal coronary origins.  Right dominance.   Right Coronary Artery: Dominant.  Normal.   Left Main Coronary Artery: Normal. Bifurcates into the LAD and LCx arteries.   Left Anterior Descending Coronary Artery: Large anterior vessel that wraps around the apex. No disease. Several small diagonal branches without disease.   Left Circumflex Artery: AV groove LCx vessel without disease.   Aorta: Normal size, 26 mm at the mid ascending aorta (level of  the PA bifurcation) measured double oblique. No calcifications. No dissection.   Aortic Valve: Trileaflet. No calcifications.   Other findings:   Normal pulmonary vein drainage into the left atrium.   Normal left atrial appendage without a thrombus.   Normal size of the pulmonary artery.   IMPRESSION: 1. No evidence of CAD, CADRADS = 0.   2. Coronary calcium score of 0. This was 0 percentile for age and sex matched control.   3. Normal coronary origin with right dominance.   4. Consider non-coronary causes of chest pain.  EKG:   EKG is personally reviewed. 10/06/2021: EKG was not ordered. 07/07/2021: Sinus rhythm. Rate 66 bpm.  Recent Labs: 02/22/2021: Hemoglobin 11.6; Platelets 288 08/12/2021: BUN 17; Creatinine, Ser 0.75; Potassium 4.0; Sodium 136; TSH 0.820   Recent Lipid  Panel    Component Value Date/Time   CHOL 186 09/27/2017 1554   TRIG 52 09/27/2017 1554   HDL 51 09/27/2017 1554   CHOLHDL 3.6 09/27/2017 1554   LDLCALC 125 (H) 09/27/2017 1554        Physical Exam:    Wt Readings from Last 3 Encounters:  01/13/22 216 lb (98 kg)  11/01/21 223 lb (101.2 kg)  10/06/21 224 lb 9.6 oz (101.9 kg)    VS:  BP 122/84   Pulse 72   Ht '5\' 3"'$  (1.6 m)   Wt 216 lb (98 kg)   SpO2 93%   BMI 38.26 kg/m  , BMI Body mass index is 38.26 kg/m. GENERAL:  Well appearing HEENT: Pupils equal round and reactive, fundi not visualized, oral mucosa unremarkable NECK:  No jugular venous distention, waveform within normal limits, carotid upstroke brisk and symmetric, no bruits, no thyromegaly LUNGS:  Clear to auscultation bilaterally HEART:  RRR.  PMI not displaced or sustained,S1 and S2 within normal limits, no S3, no S4, no clicks, no rubs, no murmurs ABD:  Flat, positive bowel sounds normal in frequency in pitch, no bruits, no rebound, no guarding, no midline pulsatile mass, no hepatomegaly, no splenomegaly EXT:  2 plus pulses throughout, no edema, no cyanosis no clubbing SKIN:  No  rashes no nodules NEURO:  Cranial nerves II through XII grossly intact, motor grossly intact throughout PSYCH:  Cognitively intact, oriented to person place and time   ASSESSMENT:    1. Essential hypertension   2. Exertional dyspnea   3. Pure hypercholesterolemia      PLAN:    Essential hypertension Blood pressures have been better controlled.  She has been good job with losing weight by increasing her exercise and making better meal choices.  She is also tolerating Ozempic well.  Continue amlodipine and hydrochlorothiazide for now.  Keep checking blood pressures periodically.  Exertional dyspnea Resolved.  Continue with regular exercise.  Coronary CTA showed no CAD.  Pure hypercholesterolemia Lipids are elevated but her coronary calcium score is 0.  Therefore we will not start her on a statin at this time.  Consider getting a repeat calcium score in 5 to 10 years.  Disposition: FU with Tenna Lacko C. Oval Linsey, MD, [facility] in 3 months.  Medication Adjustments/Labs and Tests Ordered: Current medicines are reviewed at length with the patient today.  Concerns regarding medicines are outlined above.   No orders of the defined types were placed in this encounter.  No orders of the defined types were placed in this encounter.  Patient Instructions  Medication Instructions:  Your physician recommends that you continue on your current medications as directed. Please refer to the Current Medication list given to you today. *If you need a refill on your cardiac medications before your next appointment, please call your pharmacy*  Lab Work: NONE  Testing/Procedures: NONE  Follow-Up: At [facility], you and your health needs are our priority.  As part of our continuing mission to provide you with exceptional heart care, we have created designated Provider Care Teams.  These Care Teams include your primary Cardiologist (physician) and Advanced Practice Providers (APPs -  Physician  Assistants and Nurse Practitioners) who all work together to provide you with the care you need, when you need it.  We recommend signing up for the patient portal called "MyChart".  Sign up information is provided on this After Visit Summary.  MyChart is used to connect with patients for Virtual Visits (Telemedicine).  Patients  are able to view lab/test results, encounter notes, upcoming appointments, etc.  Non-urgent messages can be sent to your provider as well.   To learn more about what you can do with MyChart, go to [email or web address].    Your next appointment:   6 month(s)  The format for your next appointment:   In Person  Provider:   Skeet Latch, MD            Signed, Skeet Latch, MD  01/13/2022 10:07 AM    [place]

## 2022-01-13 NOTE — Patient Instructions (Signed)
Medication Instructions:  Your physician recommends that you continue on your current medications as directed. Please refer to the Current Medication list given to you today.   *If you need a refill on your cardiac medications before your next appointment, please call your pharmacy*  Lab Work: NONE  Testing/Procedures: NONE  Follow-Up: At [facility], you and your health needs are our priority.  As part of our continuing mission to provide you with exceptional heart care, we have created designated Provider Care Teams.  These Care Teams include your primary Cardiologist (physician) and Advanced Practice Providers (APPs -  Physician Assistants and Nurse Practitioners) who all work together to provide you with the care you need, when you need it.  We recommend signing up for the patient portal called "MyChart".  Sign up information is provided on this After Visit Summary.  MyChart is used to connect with patients for Virtual Visits (Telemedicine).  Patients are able to view lab/test results, encounter notes, upcoming appointments, etc.  Non-urgent messages can be sent to your provider as well.   To learn more about what you can do with MyChart, go to [email or web address].    Your next appointment:   6 month(s)  The format for your next appointment:   In Person  Provider:   Tiffany [place], MD            

## 2022-01-13 NOTE — Assessment & Plan Note (Signed)
Resolved.  Continue with regular exercise.  Coronary CTA showed no CAD.

## 2022-01-19 ENCOUNTER — Other Ambulatory Visit (HOSPITAL_BASED_OUTPATIENT_CLINIC_OR_DEPARTMENT_OTHER): Payer: Self-pay | Admitting: Cardiovascular Disease

## 2022-01-19 NOTE — Telephone Encounter (Signed)
Will forward to [employer] D who started

## 2022-01-19 NOTE — Telephone Encounter (Signed)
I am not sure how to refill this. Can you help?

## 2022-04-26 ENCOUNTER — Encounter: Payer: Self-pay | Admitting: Pharmacist

## 2022-04-26 DIAGNOSIS — E1169 Type 2 diabetes mellitus with other specified complication: Secondary | ICD-10-CM

## 2022-04-26 MED ORDER — OZEMPIC (2 MG/DOSE) 8 MG/3ML ~~LOC~~ SOPN: 2.0000 mg | PEN_INJECTOR | SUBCUTANEOUS | 5 refills | Status: DC

## 2022-05-10 ENCOUNTER — Other Ambulatory Visit: Payer: Self-pay | Admitting: Cardiovascular Disease

## 2022-05-10 DIAGNOSIS — E1169 Type 2 diabetes mellitus with other specified complication: Secondary | ICD-10-CM

## 2022-06-13 ENCOUNTER — Other Ambulatory Visit (HOSPITAL_BASED_OUTPATIENT_CLINIC_OR_DEPARTMENT_OTHER): Payer: Self-pay | Admitting: Cardiovascular Disease

## 2022-06-13 NOTE — Telephone Encounter (Signed)
Rx request sent to pharmacy.  

## 2022-07-17 ENCOUNTER — Ambulatory Visit (HOSPITAL_BASED_OUTPATIENT_CLINIC_OR_DEPARTMENT_OTHER): Payer: BC Managed Care – PPO | Admitting: Cardiovascular Disease

## 2022-09-04 NOTE — Progress Notes (Signed)
Cardiology Office Note:    Date:  09/05/2022   ID:  [name], DOB 12-24-1975, MRN [id number]  PCP:  Johna Roles, PA   [facility] Providers Cardiologist:  None     Referring MD: Johna Roles, PA   No chief complaint on file.  History of Present Illness:    [name] is a 47 y.o. female with a hx of pregnancy-induced hypertension, gestational diabetes,  here for follow-up. She was initially seen 07/07/2021 for the evaluation of chronic hypertension. She saw Dr. Garwin Brothers on 04/2021 and her BP was 163/108. She was started on metoprolol at the time.  She first developed hypertension when she was pregnant in 2019.  She also reported more fatigue and SOB with minimal exertion. She followed up with our pharmacist 09/02/2021 and her blood pressure was 120/84. Her at home readings elevated throughout the day.  HCTZ was switched to chlorthalidone but she never picked this up from the pharmacy and has continued on HCTZ. She had a coronary CTA 07/2021 which revealed no CAD.  She was started on Ozempic for diabetes and weight loss. At her last appointment her blood pressures had been well controlled in the 120s/80s. She was tolerating Ozempic and working towards a goal of walking 5 days per week. She felt fatigue but no other significant exertional symptoms. No medication changes were made at that visit. As of 04/2022 her Ozempic had increased to 2 mg.   Today, she states she is feeling good with no new cardiovascular complaints. Once a week she monitors her blood pressures and states they have been steady. Confirmed in clinic with blood pressure of 112/88 today. Lately she denies routine formal exercise, although she does enjoy zumba and walking. Occasionally she will walk for up to an hour. Currently she is waiting for Ozempic to be in stock at her pharmacy. She notes this has been an issue with not being able to obtain her Ozempic consistently. When she is able to take it she  tolerates it well. Rosuvastatin had been started about 6 months ago. She will follow-up with her PCP tomorrow for repeat labs. She denies any palpitations, chest pain, shortness of breath, or peripheral edema. No lightheadedness, headaches, syncope, orthopnea, or PND.  Previous antihypertensives: Nifedipine Amlodipine  Past Medical History:  Diagnosis Date   Diabetes mellitus type 2 in obese ([place]) 10/06/2021   Diabetes mellitus without complication ([place])    gestational   Essential hypertension 07/07/2021   Exertional dyspnea 07/07/2021   Gestational diabetes    Postpartum care following cesarean delivery Indication: Repeat, SROM (12/11) 07/17/2016   Pregnancy induced hypertension     Past Surgical History:  Procedure Laterality Date   BREAST LUMPECTOMY     BREAST SURGERY     CESAREAN SECTION     CESAREAN SECTION N/A 07/17/2016   Procedure: CESAREAN SECTION;  Surgeon: Servando Salina, MD;  Location: [facility];  Service: Obstetrics;  Laterality: N/A;   CESAREAN SECTION WITH BILATERAL TUBAL LIGATION N/A 08/03/2018   Procedure: Repeat CESAREAN SECTION;  Surgeon: Servando Salina, MD;  Location: [facility];  Service: Obstetrics;  Laterality: N/A;  EDD: 08/23/18 Allergy: Codeine   IR ANGIOGRAM PELVIS SELECTIVE OR SUPRASELECTIVE  02/22/2021   IR ANGIOGRAM SELECTIVE EACH ADDITIONAL VESSEL  02/22/2021   IR ANGIOGRAM SELECTIVE EACH ADDITIONAL VESSEL  02/22/2021   IR EMBO TUMOR ORGAN ISCHEMIA INFARCT INC GUIDE ROADMAPPING  02/22/2021   IR RADIOLOGIST EVAL & MGMT  12/29/2020   IR  RADIOLOGIST EVAL & MGMT  01/11/2021   IR RADIOLOGIST EVAL & MGMT  04/05/2021   IR US GUIDE VASC ACCESS LEFT  02/22/2021    Current Medications: Current Meds  Medication Sig   amLODipine (NORVASC) 5 MG tablet Take 1 tablet (5 mg total) by mouth daily. Please keep your upcoming appointment for refills.   hydrochlorothiazide (HYDRODIURIL) 25 MG tablet Take 1 tablet (25 mg total) by mouth daily. Please  keep your upcoming appointment for refills.   metFORMIN (GLUCOPHAGE-XR) 500 MG 24 hr tablet Take 1 tablet by mouth daily.   Probiotic Product (PROBIOTIC DAILY PO) Take 1 capsule by mouth daily.   rosuvastatin (CRESTOR) 5 MG tablet Take 5 mg by mouth daily.   Semaglutide, 2 MG/DOSE, (OZEMPIC, 2 MG/DOSE,) 8 MG/3ML SOPN Inject 2 mg into the skin once a week.     Allergies:   Codeine   Social History   Socioeconomic History   Marital status: Married    Spouse name: Not on file   Number of children: Not on file   Years of education: Not on file   Highest education level: Not on file  Occupational History   Occupation: preschool teacher  Tobacco Use   Smoking status: Never   Smokeless tobacco: Never  Vaping Use   Vaping Use: Never used  Substance and Sexual Activity   Alcohol use: Not Currently    Comment: ocassional    Drug use: No   Sexual activity: Yes    Birth control/protection: None  Other Topics Concern   Not on file  Social History Narrative   Not on file   Social Determinants of Health   Financial Resource Strain: Low Risk  (07/07/2021)   Overall Financial Resource Strain (CARDIA)    Difficulty of Paying Living Expenses: Not hard at all  Food Insecurity: No Food Insecurity (07/07/2021)   Hunger Vital Sign    Worried About Running Out of Food in the Last Year: Never true    [place] in the Last Year: Never true  Transportation Needs: No Transportation Needs (07/07/2021)   PRAPARE - Hydrologist (Medical): No    Lack of Transportation (Non-Medical): No  Physical Activity: Inactive (07/07/2021)   Exercise Vital Sign    Days of Exercise per Week: 0 days    Minutes of Exercise per Session: 0 min  Stress: No Stress Concern Present (07/23/2018)   [place]    Feeling of Stress : Not at all  Social Connections: Not on file     Family History: The patient's family  history includes Diabetes in her brother, mother, and sister; Heart disease in her brother; Hypertension in her mother.  ROS:   Please see the history of present illness.    All other systems reviewed and are negative.  EKGs/Labs/Other Studies Reviewed:    The following studies were reviewed today:  Coronary CTA 07/20/2021: IMPRESSION: 1. No evidence of CAD, CADRADS = 0.   2. Coronary calcium score of 0. This was 0 percentile for age and sex matched control.   3. Normal coronary origin with right dominance.   4. Consider non-coronary causes of chest pain.  EKG:   EKG is personally reviewed. 09/05/2022: Sinus rhythm. Rate 78 bpm. 10/06/2021: EKG was not ordered. 07/07/2021: Sinus rhythm. Rate 66 bpm.  Recent Labs: No results found for requested labs within last 365 days.   Recent Lipid Panel  Component Value Date/Time   CHOL 186 09/27/2017 1554   TRIG 52 09/27/2017 1554   HDL 51 09/27/2017 1554   CHOLHDL 3.6 09/27/2017 1554   LDLCALC 125 (H) 09/27/2017 1554        Physical Exam:    Wt Readings from Last 3 Encounters:  09/05/22 210 lb 4.8 oz (95.4 kg)  01/13/22 216 lb (98 kg)  11/01/21 223 lb (101.2 kg)    VS:  BP 112/88 (BP Location: Left Arm, Patient Position: Sitting, Cuff Size: Large)   Pulse 78   Ht '5\' 3"'$  (1.6 m)   Wt 210 lb 4.8 oz (95.4 kg)   BMI 37.25 kg/m  , BMI Body mass index is 37.25 kg/m. GENERAL:  Well appearing HEENT: Pupils equal round and reactive, fundi not visualized, oral mucosa unremarkable NECK:  No jugular venous distention, waveform within normal limits, carotid upstroke brisk and symmetric, no bruits, no thyromegaly LUNGS:  Clear to auscultation bilaterally HEART:  RRR.  PMI not displaced or sustained,S1 and S2 within normal limits, no S3, no S4, no clicks, no rubs, no murmurs ABD:  Flat, positive bowel sounds normal in frequency in pitch, no bruits, no rebound, no guarding, no midline pulsatile mass, no hepatomegaly, no  splenomegaly EXT:  2 plus pulses throughout, no edema, no cyanosis no clubbing SKIN:  No rashes no nodules NEURO:  Cranial nerves II through XII grossly intact, motor grossly intact throughout PSYCH:  Cognitively intact, oriented to person place and time   ASSESSMENT:    1. Essential hypertension   2. Pure hypercholesterolemia     PLAN:    Essential hypertension Blood pressure has been well-controlled at home.  Continue amlodipine, HCTZ.  Encouraged her to work in increasing her exercise.  Goal is <150 minutes weekly.  Blood pressure goal is less than 130/80.  Pure hypercholesterolemia Started rosuvastatin 6 months ago.  She has follow-up with her PCP tomorrow will be getting her lipids checked at that time.  Continue rosuvastatin.  Continue working on diet and exercise as above.  Obesity Continue working on diet and exercise as above.  We will also refer her to the PREP program to the [facility].  Continue Ozempic.  Overall she is tolerating it well.   Disposition: FU with Akire Rennert C. Oval Linsey, MD, [facility] in 1 year.  Medication Adjustments/Labs and Tests Ordered: Current medicines are reviewed at length with the patient today.  Concerns regarding medicines are outlined above.   Orders Placed This Encounter  Procedures   Amb Referral To Provider Referral Exercise Program (P.R.E.P)   EKG 12-Lead   No orders of the defined types were placed in this encounter.  Patient Instructions  Medication Instructions:  Your physician recommends that you continue on your current medications as directed. Please refer to the Current Medication list given to you today.  *If you need a refill on your cardiac medications before your next appointment, please call your pharmacy*  Lab Work: NONE  Testing/Procedures: NONE  Follow-Up: At [facility], you and your health needs are our priority.  As part of our continuing mission to provide you with exceptional heart care, we have created  designated Provider Care Teams.  These Care Teams include your primary Cardiologist (physician) and Advanced Practice Providers (APPs -  Physician Assistants and Nurse Practitioners) who all work together to provide you with the care you need, when you need it.  We recommend signing up for the patient portal called "MyChart".  Sign up information is provided on  this After Visit Summary.  MyChart is used to connect with patients for Virtual Visits (Telemedicine).  Patients are able to view lab/test results, encounter notes, upcoming appointments, etc.  Non-urgent messages can be sent to your provider as well.   To learn more about what you can do with MyChart, go to [email or web address].    Your next appointment:   12 month(s)  The format for your next appointment:   In Person  Provider:   DR [place] TO PREP, IF YOU DO NOT HEAR FROM PAM OR [place] IN 2 WEEKS CALL THE OFFICE TO FOLLOW UP    I,Mathew Stumpf,acting as a scribe for Skeet Latch, MD.,have documented all relevant documentation on the behalf of Skeet Latch, MD,as directed by  Skeet Latch, MD while in the presence of Skeet Latch, MD.  I, [place] Oval Linsey, MD have reviewed all documentation for this visit.  The documentation of the exam, diagnosis, procedures, and orders on 09/05/2022 are all accurate and complete.   Signed, Skeet Latch, MD  09/05/2022 9:16 AM    [place]

## 2022-09-05 ENCOUNTER — Ambulatory Visit (HOSPITAL_BASED_OUTPATIENT_CLINIC_OR_DEPARTMENT_OTHER): Payer: BC Managed Care – PPO | Admitting: Cardiovascular Disease

## 2022-09-05 ENCOUNTER — Encounter (HOSPITAL_BASED_OUTPATIENT_CLINIC_OR_DEPARTMENT_OTHER): Payer: Self-pay | Admitting: Cardiovascular Disease

## 2022-09-05 VITALS — BP 112/88 | HR 78 | Ht 63.0 in | Wt 210.3 lb

## 2022-09-05 DIAGNOSIS — E7800 Pure hypercholesterolemia, unspecified: Secondary | ICD-10-CM

## 2022-09-05 DIAGNOSIS — I10 Essential (primary) hypertension: Secondary | ICD-10-CM | POA: Diagnosis not present

## 2022-09-05 NOTE — Assessment & Plan Note (Signed)
Continue working on diet and exercise as above.  We will also refer her to the PREP program to the [facility].  Continue Ozempic.  Overall she is tolerating it well.

## 2022-09-05 NOTE — Assessment & Plan Note (Signed)
Started rosuvastatin 6 months ago.  She has follow-up with her PCP tomorrow will be getting her lipids checked at that time.  Continue rosuvastatin.  Continue working on diet and exercise as above.

## 2022-09-05 NOTE — Patient Instructions (Addendum)
Medication Instructions:  Your physician recommends that you continue on your current medications as directed. Please refer to the Current Medication list given to you today.  *If you need a refill on your cardiac medications before your next appointment, please call your pharmacy*  Lab Work: NONE  Testing/Procedures: NONE  Follow-Up: At [facility], you and your health needs are our priority.  As part of our continuing mission to provide you with exceptional heart care, we have created designated Provider Care Teams.  These Care Teams include your primary Cardiologist (physician) and Advanced Practice Providers (APPs -  Physician Assistants and Nurse Practitioners) who all work together to provide you with the care you need, when you need it.  We recommend signing up for the patient portal called "MyChart".  Sign up information is provided on this After Visit Summary.  MyChart is used to connect with patients for Virtual Visits (Telemedicine).  Patients are able to view lab/test results, encounter notes, upcoming appointments, etc.  Non-urgent messages can be sent to your provider as well.   To learn more about what you can do with MyChart, go to [email or web address].    Your next appointment:   12 month(s)  The format for your next appointment:   In Person  Provider:   DR Ebro TO PREP, IF YOU DO NOT HEAR FROM PAM OR [place] IN 2 WEEKS CALL THE OFFICE TO FOLLOW UP

## 2022-09-05 NOTE — Assessment & Plan Note (Addendum)
Blood pressure has been well-controlled at home.  Continue amlodipine, HCTZ.  Encouraged her to work in increasing her exercise.  Goal is <150 minutes weekly.  Blood pressure goal is less than 130/80.

## 2022-09-14 ENCOUNTER — Telehealth: Payer: Self-pay

## 2022-09-14 NOTE — Telephone Encounter (Signed)
Call from pt. Calling to inquire about PREP. Hadn't had a call yet from anyone about program. Explained program to pt. Interested in participating would need an evening class. T/TH 6p-715p.Explained have a bit of a backlog of referrals and will call her as soon as I have an evening class starting. Given my cell phone for call back.

## 2022-09-15 ENCOUNTER — Telehealth: Payer: Self-pay

## 2022-09-15 NOTE — Telephone Encounter (Signed)
Confirmed can start PREP on 09/26/22 T/TH 6p-715p. Intake scheduled for 2/13 430p

## 2022-09-15 NOTE — Telephone Encounter (Signed)
Call to pt reference next PREP class starting on 09/26/22 on T/TH 6p-715p. Request call back if can attend.

## 2022-09-17 ENCOUNTER — Other Ambulatory Visit (HOSPITAL_BASED_OUTPATIENT_CLINIC_OR_DEPARTMENT_OTHER): Payer: Self-pay | Admitting: Cardiovascular Disease

## 2022-09-18 NOTE — Telephone Encounter (Signed)
Rx request sent to pharmacy.  

## 2022-09-19 NOTE — Progress Notes (Signed)
[facility] PREP Evaluation  Patient Details  Name: [name] MRN: [id number] Date of Birth: October 24, 1975 Age: 47 y.o. PCP: Johna Roles, PA  Vitals:   09/19/22 1706  BP: 126/88  Pulse: 86  SpO2: 94%  Weight: 208 lb 6.4 oz (94.5 kg)     [facility] Eval - 09/19/22 1700       [facility] "PREP" Location   [facility] "PREP" Location [facility]      Referral    Referring Provider Oval Linsey    Reason for referral Inactivity   prediabetic   Program Start Date 09/26/22   T/TH 6p-715 p x 12 wks     Measurement   Waist Circumference 47.5 inches    Hip Circumference 49.5 inches    Body fat 41 percent      Information for Trainer   Goals Lose weight. Goal 160 lbs, 24 over the 12 wks, get off metformin, no prediabetes    Current Exercise none    Orthopedic Concerns none    Pertinent Medical History HTN, GDM, prediabetes    Current Barriers none    Restrictions/Precautions --   none   Medications that affect exercise Medication causing dizziness/drowsiness      Timed Up and Go (TUGS)   Timed Up and Go Low risk <9 seconds      Mobility and Daily Activities   I find it easy to walk up or down two or more flights of stairs. 3    I have no trouble taking out the trash. 4    I do housework such as vacuuming and dusting on my own without difficulty. 4    I can easily lift a gallon of milk (8lbs). 4    I can easily walk a mile. 4    I have no trouble reaching into high cupboards or reaching down to pick up something from the floor. 4    I do not have trouble doing out-door work such as Armed forces logistics/support/administrative officer, raking leaves, or gardening. 4      Mobility and Daily Activities   I feel younger than my age. 4    I feel independent. 4    I feel energetic. 4    I live an active life.  1    I feel strong. 4    I feel healthy. 1    I feel active as other people my age. 4      How fit and strong are you.   Fit and Strong Total Score 49            Past Medical History:  Diagnosis Date    Diabetes mellitus type 2 in obese ([place]) 10/06/2021   Diabetes mellitus without complication ([place])    gestational   Essential hypertension 07/07/2021   Exertional dyspnea 07/07/2021   Gestational diabetes    Postpartum care following cesarean delivery Indication: Repeat, SROM (12/11) 07/17/2016   Pregnancy induced hypertension    Past Surgical History:  Procedure Laterality Date   BREAST LUMPECTOMY     BREAST SURGERY     CESAREAN SECTION     CESAREAN SECTION N/A 07/17/2016   Procedure: CESAREAN SECTION;  Surgeon: Servando Salina, MD;  Location: [place];  Service: Obstetrics;  Laterality: N/A;   CESAREAN SECTION WITH BILATERAL TUBAL LIGATION N/A 08/03/2018   Procedure: Repeat CESAREAN SECTION;  Surgeon: Servando Salina, MD;  Location: [place];  Service: Obstetrics;  Laterality: N/A;  EDD: 08/23/18 Allergy: Codeine   IR  ANGIOGRAM PELVIS SELECTIVE OR SUPRASELECTIVE  02/22/2021   IR ANGIOGRAM SELECTIVE EACH ADDITIONAL VESSEL  02/22/2021   IR ANGIOGRAM SELECTIVE EACH ADDITIONAL VESSEL  02/22/2021   IR EMBO TUMOR ORGAN ISCHEMIA INFARCT INC GUIDE ROADMAPPING  02/22/2021   IR RADIOLOGIST EVAL & MGMT  12/29/2020   IR RADIOLOGIST EVAL & MGMT  01/11/2021   IR RADIOLOGIST EVAL & MGMT  04/05/2021   IR US GUIDE VASC ACCESS LEFT  02/22/2021   Social History   Tobacco Use  Smoking Status Never  Smokeless Tobacco Never    Barnett Hatter 09/19/2022, 5:12 PM

## 2022-09-22 ENCOUNTER — Encounter (HOSPITAL_BASED_OUTPATIENT_CLINIC_OR_DEPARTMENT_OTHER): Payer: Self-pay

## 2022-10-04 NOTE — Progress Notes (Signed)
[facility] PREP Weekly Session  Patient Details  Name: [name] MRN: [id number] Date of Birth: March 18, 1976 Age: 47 y.o. PCP: Johna Roles, PA  Vitals:   10/03/22 1800  Weight: 203 lb 12.8 oz (92.4 kg)     [facility] Weekly seesion - 10/04/22 0900       [facility] "PREP" Location   [facility] "PREP" Location [facility]      Weekly Session   Topic Discussed Importance of resistance training;Other ways to be active   fit testing   Minutes exercised this week 120 minutes    Classes attended to date [place] 10/04/2022, 9:24 AM

## 2022-10-12 NOTE — Progress Notes (Signed)
[facility] PREP Weekly Session  Patient Details  Name: [name] MRN: [id number] Date of Birth: 1976/03/28 Age: 47 y.o. PCP: Johna Roles, PA  Vitals:   10/10/22 1800  Weight: 202 lb 12.8 oz (92 kg)     [facility] Weekly seesion - 10/12/22 1500       [facility] "PREP" Location   [facility] "PREP" Location [facility]      Weekly Session   Topic Discussed Healthy eating tips    Minutes exercised this week 174 minutes    Classes attended to date [place] 10/12/2022, 3:03 PM

## 2022-10-19 NOTE — Progress Notes (Signed)
[facility] PREP Weekly Session  Patient Details  Name: [name] MRN: [id number] Date of Birth: 12-20-1975 Age: 47 y.o. PCP: Johna Roles, PA  Vitals:   10/17/22 1800  Weight: 201 lb 12.8 oz (91.5 kg)     [facility] Weekly seesion - 10/19/22 1600       [facility] "PREP" Location   [facility] "PREP" Location [facility]      Weekly Session   Topic Discussed Health habits    Minutes exercised this week 170 minutes    Classes attended to date [place] 10/19/2022, 4:48 PM

## 2022-11-01 NOTE — Progress Notes (Signed)
[facility] PREP Weekly Session  Patient Details  Name: [name] MRN: [id number] Date of Birth: Dec 07, 1975 Age: 47 y.o. PCP: Johna Roles, PA  Vitals:   10/31/22 1800  Weight: 197 lb (89.4 kg)     [facility] Weekly seesion - 11/01/22 0900       [facility] "PREP" Location   [facility] "PREP" Location [facility]      Weekly Session   Topic Discussed Stress management and problem solving   meditation   Minutes exercised this week 0 minutes    Classes attended to date [place] 11/01/2022, 9:52 AM

## 2022-12-17 ENCOUNTER — Other Ambulatory Visit: Payer: Self-pay | Admitting: Cardiovascular Disease

## 2022-12-17 DIAGNOSIS — E1169 Type 2 diabetes mellitus with other specified complication: Secondary | ICD-10-CM

## 2022-12-18 NOTE — Telephone Encounter (Signed)
Please review for refill. Thank you! 

## 2023-01-02 NOTE — Progress Notes (Signed)
PREP 09/26/22 to 12/26/22 Attended 11 total of 24 sessions, 5 of 12 educational topics Did not return for final measurements.

## 2023-03-12 ENCOUNTER — Other Ambulatory Visit (HOSPITAL_BASED_OUTPATIENT_CLINIC_OR_DEPARTMENT_OTHER): Payer: Self-pay | Admitting: Cardiovascular Disease

## 2023-08-23 ENCOUNTER — Other Ambulatory Visit (HOSPITAL_BASED_OUTPATIENT_CLINIC_OR_DEPARTMENT_OTHER): Payer: Self-pay | Admitting: Cardiovascular Disease
